# Patient Record
Sex: Female | Born: 1985
Health system: Southern US, Community
[De-identification: ages and names within clinical notes are randomized; demographics above are authoritative.]

## PROBLEM LIST (undated history)

## (undated) DIAGNOSIS — F329 Major depressive disorder, single episode, unspecified: Secondary | ICD-10-CM

## (undated) DIAGNOSIS — R519 Headache, unspecified: Secondary | ICD-10-CM

## (undated) DIAGNOSIS — G8929 Other chronic pain: Secondary | ICD-10-CM

## (undated) DIAGNOSIS — G5600 Carpal tunnel syndrome, unspecified upper limb: Secondary | ICD-10-CM

## (undated) DIAGNOSIS — F32A Depression, unspecified: Secondary | ICD-10-CM

## (undated) DIAGNOSIS — R51 Headache: Secondary | ICD-10-CM

## (undated) DIAGNOSIS — F419 Anxiety disorder, unspecified: Secondary | ICD-10-CM

## (undated) DIAGNOSIS — K289 Gastrojejunal ulcer, unspecified as acute or chronic, without hemorrhage or perforation: Secondary | ICD-10-CM

## (undated) HISTORY — DX: Other chronic pain: G89.29

## (undated) HISTORY — PX: NO PAST SURGERIES: SHX2092

## (undated) HISTORY — DX: Gastrojejunal ulcer, unspecified as acute or chronic, without hemorrhage or perforation: K28.9

## (undated) HISTORY — DX: Headache, unspecified: R51.9

## (undated) HISTORY — DX: Major depressive disorder, single episode, unspecified: F32.9

## (undated) HISTORY — DX: Anxiety disorder, unspecified: F41.9

## (undated) HISTORY — DX: Headache: R51

## (undated) HISTORY — DX: Depression, unspecified: F32.A

---

## 2011-05-17 ENCOUNTER — Other Ambulatory Visit (HOSPITAL_COMMUNITY)
Admission: RE | Admit: 2011-05-17 | Discharge: 2011-05-17 | Disposition: A | Discharge status: Home / Self Care | Payer: BC Managed Care – PPO | Source: Ambulatory Visit | Attending: Obstetrics and Gynecology | Admitting: Obstetrics and Gynecology

## 2011-05-17 DIAGNOSIS — Z124 Encounter for screening for malignant neoplasm of cervix: Secondary | ICD-10-CM | POA: Insufficient documentation

## 2012-11-03 ENCOUNTER — Ambulatory Visit (INDEPENDENT_AMBULATORY_CARE_PROVIDER_SITE_OTHER): Payer: BC Managed Care – PPO | Admitting: Physician Assistant

## 2012-11-03 VITALS — BP 112/76 | HR 88 | Temp 98.8°F | Resp 12 | Ht 64.0 in | Wt 160.0 lb

## 2012-11-03 DIAGNOSIS — A088 Other specified intestinal infections: Secondary | ICD-10-CM

## 2012-11-03 DIAGNOSIS — R197 Diarrhea, unspecified: Secondary | ICD-10-CM

## 2012-11-03 DIAGNOSIS — R112 Nausea with vomiting, unspecified: Secondary | ICD-10-CM

## 2012-11-03 DIAGNOSIS — A084 Viral intestinal infection, unspecified: Secondary | ICD-10-CM

## 2012-11-03 LAB — POCT CBC
HCT, POC: 41.9 % (ref 37.7–47.9)
Hemoglobin: 12.9 g/dL (ref 12.2–16.2)
Lymph, poc: 2.1 (ref 0.6–3.4)
MCH, POC: 26.1 pg — AB (ref 27–31.2)
MPV: 8.5 fL (ref 0–99.8)
POC MID %: 10 %M (ref 0–12)
RBC: 4.94 M/uL (ref 4.04–5.48)
WBC: 8.2 10*3/uL (ref 4.6–10.2)

## 2012-11-03 LAB — POCT URINALYSIS DIPSTICK
Glucose, UA: NEGATIVE
Spec Grav, UA: 1.025

## 2012-11-03 LAB — POCT UA - MICROSCOPIC ONLY
Bacteria, U Microscopic: NEGATIVE
Crystals, Ur, HPF, POC: NEGATIVE
Mucus, UA: POSITIVE

## 2012-11-03 LAB — POCT URINE PREGNANCY: Preg Test, Ur: NEGATIVE

## 2012-11-03 MED ORDER — ONDANSETRON 4 MG PO TBDP: 8.0000 mg | ORAL_TABLET | Freq: Once | ORAL | Status: AC

## 2012-11-03 MED ORDER — ONDANSETRON 8 MG PO TBDP: 8.0000 mg | ORAL_TABLET | Freq: Three times a day (TID) | ORAL | Status: DC | PRN

## 2012-11-03 NOTE — Progress Notes (Signed)
Subjective:    Patient ID: Catherine Downs, female    DOB: 1985-12-13, 27 y.o.   MRN: [id number]  HPI    Ms. Lanahan is a very pleasant 27 yr old female here with concern for illness.  States that 3 days ago she woke up with "severe stabbing" epigastric pain.  Vomited and was nauseous, couldn't eat.  Continued with nausea and vomiting yesterday and today, now with diarrhea as well.  Minimal appetite, difficulty keeping food down but is able to maintain hydration.  Denies fever or chills.  No sick contacts.  Has had maybe 6 episodes of vomiting and 3 episodes of diarrhea.  No recent travel.  Was at a wedding Saturday night, but aside from that no new foods.  No hx of heart burn or reflux.  No urinary symptoms.  No vaginal discharge or concern for STI.     Review of Systems  Constitutional: Negative for fever and chills.  HENT: Negative.   Respiratory: Negative.   Cardiovascular: Negative.   Gastrointestinal: Positive for nausea, vomiting, abdominal pain and diarrhea.  Musculoskeletal: Negative.   Skin: Negative.   Neurological: Negative.        Objective:   Physical Exam  Vitals reviewed. Constitutional: She is oriented to person, place, and time. She appears well-developed and well-nourished. No distress.  HENT:  Head: Normocephalic and atraumatic.  Eyes: Conjunctivae are normal. No scleral icterus.  Cardiovascular: Normal rate, regular rhythm and normal heart sounds.   Pulmonary/Chest: Effort normal and breath sounds normal. She has no wheezes. She has no rales.  Abdominal: Soft. Normal appearance and bowel sounds are normal. There is tenderness in the right upper quadrant, right lower quadrant and periumbilical area. There is no rigidity, no rebound, no guarding, no CVA tenderness and negative Murphy's sign.  Neurological: She is alert and oriented to person, place, and time.  Skin: Skin is warm and dry.  Psychiatric: She has a normal mood and affect. Her behavior is normal.      Filed Vitals:   11/03/12 1243  BP: 112/76  Pulse: 88  Temp: 98.8 F (37.1 C)  Resp: 12     Results for orders placed in visit on 11/03/12  POCT CBC      Result Value Range   WBC 8.2  4.6 - 10.2 K/uL   Lymph, poc 2.1  0.6 - 3.4   POC LYMPH PERCENT 25.5  10 - 50 %L   MID (cbc) 0.8  0 - 0.9   POC MID % 10.0  0 - 12 %M   POC Granulocyte 5.3  2 - 6.9   Granulocyte percent 64.5  37 - 80 %G   RBC 4.94  4.04 - 5.48 M/uL   Hemoglobin 12.9  12.2 - 16.2 g/dL   HCT, POC 40.9  81.1 - 47.9 %   MCV 84.8  80 - 97 fL   MCH, POC 26.1 (*) 27 - 31.2 pg   MCHC 30.8 (*) 31.8 - 35.4 g/dL   RDW, POC 91.4     Platelet Count, POC 382  142 - 424 K/uL   MPV 8.5  0 - 99.8 fL  POCT URINE PREGNANCY      Result Value Range   Preg Test, Ur Negative    POCT UA - MICROSCOPIC ONLY      Result Value Range   WBC, Ur, HPF, POC 0-1     RBC, urine, microscopic 0-3     Bacteria, U Microscopic negative  Mucus, UA positive     Epithelial cells, urine per micros 0-1     Crystals, Ur, HPF, POC negative     Casts, Ur, LPF, POC negative     Yeast, UA negative    POCT URINALYSIS DIPSTICK      Result Value Range   Color, UA yellow     Clarity, UA clear     Glucose, UA negative     Bilirubin, UA negative     Ketones, UA trace     Spec Grav, UA 1.025     Blood, UA trace-lysed     pH, UA 5.5     Protein, UA negative     Urobilinogen, UA 0.2     Nitrite, UA negative     Leukocytes, UA Negative         Assessment & Plan:  Viral gastroenteritis  Nausea with vomiting - Plan: POCT CBC, POCT urine pregnancy, POCT UA - Microscopic Only, POCT urinalysis dipstick, ondansetron (ZOFRAN-ODT) disintegrating tablet 8 mg, ondansetron (ZOFRAN-ODT) 8 MG disintegrating tablet  Diarrhea - Plan: POCT CBC, POCT UA - Microscopic Only, POCT urinalysis dipstick   Ms. Barbara is a very pleasant 27 yr old female here with three days of nausea, vomiting, diarrhea.  On exam she is well-appearing, afebrile, VSS.  There  is some more pronounced tenderness in the RUQ and RLQ, but generalized tenderness throughout the abdomen.  CBC is normal.  UA is normal.  HCG negative.  Suspect viral gastroenteritis.  Zofran given in the office and sent to pharmacy to use q8h prn.  Encouraged hydration.  Advance diet as tolerated.  Work note provided.  Discussed RTC and ED precautions with pt who understands and is in agreement with this plan.

## 2012-11-03 NOTE — Patient Instructions (Addendum)
Take the Zofran every 8 hours as needed for nausea.  Continue hydrating well.  Eat as you are able.  If your symptoms are worsening (worsening abdominal pain, worsening vomiting, fever, etc) please come back in or go to the emergency department.   Viral Gastroenteritis Viral gastroenteritis is also known as stomach flu. This condition affects the stomach and intestinal tract. It can cause sudden diarrhea and vomiting. The illness typically lasts 3 to 8 days. Most people develop an immune response that eventually gets rid of the virus. While this natural response develops, the virus can make you quite ill. CAUSES  Many different viruses can cause gastroenteritis, such as rotavirus or noroviruses. You can catch one of these viruses by consuming contaminated food or water. You may also catch a virus by sharing utensils or other personal items with an infected person or by touching a contaminated surface. SYMPTOMS  The most common symptoms are diarrhea and vomiting. These problems can cause a severe loss of body fluids (dehydration) and a body salt (electrolyte) imbalance. Other symptoms may include:  Fever.  Headache.  Fatigue.  Abdominal pain. DIAGNOSIS  Your caregiver can usually diagnose viral gastroenteritis based on your symptoms and a physical exam. A stool sample may also be taken to test for the presence of viruses or other infections. TREATMENT  This illness typically goes away on its own. Treatments are aimed at rehydration. The most serious cases of viral gastroenteritis involve vomiting so severely that you are not able to keep fluids down. In these cases, fluids must be given through an intravenous line (IV). HOME CARE INSTRUCTIONS   Drink enough fluids to keep your urine clear or pale yellow. Drink small amounts of fluids frequently and increase the amounts as tolerated.  Ask your caregiver for specific rehydration instructions.  Avoid:  Foods high in  sugar.  Alcohol.  Carbonated drinks.  Tobacco.  Juice.  Caffeine drinks.  Extremely hot or cold fluids.  Fatty, greasy foods.  Too much intake of anything at one time.  Dairy products until 24 to 48 hours after diarrhea stops.  You may consume probiotics. Probiotics are active cultures of beneficial bacteria. They may lessen the amount and number of diarrheal stools in adults. Probiotics can be found in yogurt with active cultures and in supplements.  Wash your hands well to avoid spreading the virus.  Only take over-the-counter or prescription medicines for pain, discomfort, or fever as directed by your caregiver. Do not give aspirin to children. Antidiarrheal medicines are not recommended.  Ask your caregiver if you should continue to take your regular prescribed and over-the-counter medicines.  Keep all follow-up appointments as directed by your caregiver. SEEK IMMEDIATE MEDICAL CARE IF:   You are unable to keep fluids down.  You do not urinate at least once every 6 to 8 hours.  You develop shortness of breath.  You notice blood in your stool or vomit. This may look like coffee grounds.  You have abdominal pain that increases or is concentrated in one small area (localized).  You have persistent vomiting or diarrhea.  You have a fever.  The patient is a child younger than 3 months, and he or she has a fever.  The patient is a child older than 3 months, and he or she has a fever and persistent symptoms.  The patient is a child older than 3 months, and he or she has a fever and symptoms suddenly get worse.  The patient is a baby, and  he or she has no tears when crying. MAKE SURE YOU:   Understand these instructions.  Will watch your condition.  Will get help right away if you are not doing well or get worse. Document Released: 07/08/2005 Document Revised: 09/30/2011 Document Reviewed: 04/24/2011 [facility] Patient Information 2013 [place], [place].

## 2015-07-20 ENCOUNTER — Emergency Department (HOSPITAL_COMMUNITY)
Admission: EM | Admit: 2015-07-20 | Discharge: 2015-07-20 | Disposition: A | Discharge status: Home / Self Care | Payer: BLUE CROSS/BLUE SHIELD | Attending: Emergency Medicine | Admitting: Emergency Medicine

## 2015-07-20 ENCOUNTER — Encounter (HOSPITAL_COMMUNITY): Payer: Self-pay | Admitting: Cardiology

## 2015-07-20 DIAGNOSIS — S199XXA Unspecified injury of neck, initial encounter: Secondary | ICD-10-CM | POA: Insufficient documentation

## 2015-07-20 DIAGNOSIS — Y9389 Activity, other specified: Secondary | ICD-10-CM | POA: Diagnosis not present

## 2015-07-20 DIAGNOSIS — W228XXA Striking against or struck by other objects, initial encounter: Secondary | ICD-10-CM | POA: Insufficient documentation

## 2015-07-20 DIAGNOSIS — S060X0A Concussion without loss of consciousness, initial encounter: Secondary | ICD-10-CM

## 2015-07-20 DIAGNOSIS — Y998 Other external cause status: Secondary | ICD-10-CM | POA: Insufficient documentation

## 2015-07-20 DIAGNOSIS — Z880 Allergy status to penicillin: Secondary | ICD-10-CM | POA: Insufficient documentation

## 2015-07-20 DIAGNOSIS — S0990XA Unspecified injury of head, initial encounter: Secondary | ICD-10-CM | POA: Diagnosis present

## 2015-07-20 DIAGNOSIS — Y92000 Kitchen of unspecified non-institutional (private) residence as  the place of occurrence of the external cause: Secondary | ICD-10-CM | POA: Diagnosis not present

## 2015-07-20 MED ORDER — TRAMADOL HCL 50 MG PO TABS: 50.0000 mg | ORAL_TABLET | Freq: Four times a day (QID) | ORAL | Status: DC | PRN

## 2015-07-20 NOTE — ED Notes (Signed)
Pt reports she was leaning down and come up hitting her head on a bar. [facility] to have a follow up CT done but has not been called about anything yet. Reports a headache

## 2015-07-20 NOTE — ED Provider Notes (Signed)
CSN: [id number]     Arrival date & time 07/20/15  1336 History  By signing my name below, I, Essence Howell, attest that this documentation has been prepared under the direction and in the presence of Marlon Pel, PA-C Electronically Signed: Charline Bills, ED Scribe 07/27/2015 at 2:56 PM.   Chief Complaint  Patient presents with  . Headache  . Fall   The history is provided by the patient. No language interpreter was used.   HPI Comments: Catherine Downs is a 29 y.o. female who presents to the Emergency Department complaining of persistent, unchanged HA for the past 8 days. Pt states that she was kneeling down in her kitchen when she struck the top of her head on the bar while standing 8 days ago. She was seen at Urgent [facility] at [facility] following the incident and was referred to [facility] imaging for a CT same day but she has not received a call from them to set up an appointment. Pt reports a constant, throbbing sensation to the top of her head where it hit since the incident. She also reports associated mild nausea, intermittent mild dizziness, disorientation, neck pain at the base of her head. She has tried Bupap with temporary relief. Pt denies vomiting.    ROS: The patient denies diaphoresis, fever, headache, weakness (general or focal), confusion, change of vision,  dysphagia, aphagia, shortness of breath,  abdominal pains, nausea, vomiting, diarrhea, lower extremity swelling, rash, neck pain, chest pain   History reviewed. No pertinent past medical history. History reviewed. No pertinent past surgical history. History reviewed. No pertinent family history. Social History  Substance Use Topics  . Smoking status: Never Smoker   . Smokeless tobacco: None  . Alcohol Use: No   OB History    No data available     Review of Systems  Gastrointestinal: Positive for nausea. Negative for vomiting.  Musculoskeletal: Positive for neck pain.  Neurological: Positive for dizziness and  headaches.  All other systems reviewed and are negative.  Allergies  Penicillins  Home Medications   Prior to Admission medications   Medication Sig Start Date End Date Taking? Authorizing Provider  ondansetron (ZOFRAN-ODT) 8 MG disintegrating tablet Take 1 tablet (8 mg total) by mouth every 8 (eight) hours as needed for nausea. 11/03/12   Eleanore Delia Chimes, PA-C  traMADol (ULTRAM) 50 MG tablet Take 1 tablet (50 mg total) by mouth every 6 (six) hours as needed. 07/20/15   Scarlette Hogston Neva Seat, PA-C   BP 117/90 mmHg  Pulse 88  Temp(Src) 98.8 F (37.1 C) (Oral)  Resp 18  Ht  (1.6 m)  Wt 169 lb (76.658 kg)  BMI 29.94 kg/m2  SpO2 98%  LMP 07/06/2015 Physical Exam  Constitutional: She is oriented to person, place, and time. She appears well-developed and well-nourished. No distress.  HENT:  Head: Normocephalic and atraumatic.  Right Ear: Tympanic membrane and ear canal normal.  Left Ear: Tympanic membrane and ear canal normal.  Nose: Nose normal.  Mouth/Throat: Uvula is midline, oropharynx is clear and moist and mucous membranes are normal.  Eyes: Conjunctivae and EOM are normal. Pupils are equal, round, and reactive to light.  Neck: Normal range of motion. Neck supple. Muscular tenderness present. No spinous process tenderness present. No rigidity. No tracheal deviation and normal range of motion present.  Cardiovascular: Normal rate and regular rhythm.   Pulmonary/Chest: Effort normal. No respiratory distress.  Abdominal: Soft.  No signs of abdominal distention  Musculoskeletal: Normal range of motion.  No LE swelling  Neurological: She is alert and oriented to person, place, and time.   optic, oculomotor, trochlear, trigeminal, abducens, facial, acoustic, speech and swallow (II-VIII and XII) cranial nerves grossly intact on exam. Pt alert and oriented x 3 Upper and lower extremity strength is symmetrical and physiologic Normal muscular tone No facial droop Coordination intact,  no limb ataxia, finger-nose-finger normal Rapid alternating movements normal No pronator drift   Skin: Skin is warm and dry. No rash noted.  Psychiatric: She has a normal mood and affect. Her behavior is normal.  Nursing note and vitals reviewed.  ED Course  Procedures (including critical care time) DIAGNOSTIC STUDIES: Oxygen Saturation is 98% on RA, normal by my interpretation.    COORDINATION OF CARE:   2:45 PM-Discussed treatment plan which includes Ultram with pt at bedside and pt agreed to plan.  I told pt that 8 days out from injury and norma, neuro exam I did not feel like it was warranted but if she would like a CT I would order. She declined CT scan of the head. Pt also offered declined IV medications for headache but she declined and did not want any IV medications.   Labs Review Labs Reviewed - No data to display  Imaging Review No results found. I have personally reviewed and evaluated these images and lab results as part of my medical decision-making.   EKG Interpretation None      MDM   Final diagnoses:  Concussion, without loss of consciousness, initial encounter   Pt HA treated and improved while in ED.  Presentation is like pts typical HA and non [facility], Meningitis, or temporal arteritis. Pt is afebrile with no focal neuro deficits, nuchal rigidity, or change in vision. Pt is to follow up with PCP to discuss prophylactic medication. Pt verbalizes understanding and is agreeable with plan to dc.   Presentation is non concerning for [facility], ICH, Meningitis, or temporal arteritis. Pt is afebrile with no focal neuro deficits, nuchal rigidity, or change in vision. The patient denies any symptoms of neurological impairment or TIA's; no amaurosis, diplopia, dysphasia, or unilateral disturbance of motor or sensory function. No loss of balance or vertigo.   I personally performed the services described in this documentation, which was scribed in my  presence. The recorded information has been reviewed and is accurate.    Marlon Peliffany Kaegan Stigler, PA-C 07/27/15 [id number]  Donnetta HutchingBrian Cook, MD 08/04/15 [phone]

## 2015-07-20 NOTE — Discharge Instructions (Signed)
Concussion, Adult  A concussion, or closed-head injury, is a brain injury caused by a direct blow to the head or by a quick and sudden movement (jolt) of the head or neck. Concussions are usually not life-threatening. Even so, the effects of a concussion can be serious. If you have had a concussion before, you are more likely to experience concussion-like symptoms after a direct blow to the head.   CAUSES  · Direct blow to the head, such as from running into another player during a soccer game, being hit in a fight, or hitting your head on a hard surface.  · A jolt of the head or neck that causes the brain to move back and forth inside the skull, such as in a car crash.  SIGNS AND SYMPTOMS  The signs of a concussion can be hard to notice. Early on, they may be missed by you, family members, and health care providers. You may look fine but act or feel differently.  Symptoms are usually temporary, but they may last for days, weeks, or even longer. Some symptoms may appear right away while others may not show up for hours or days. Every head injury is different. Symptoms include:  · Mild to moderate headaches that will not go away.  · A feeling of pressure inside your head.  · Having more trouble than usual:    Learning or remembering things you have heard.    Answering questions.    Paying attention or concentrating.    Organizing daily tasks.    Making decisions and solving problems.  · Slowness in thinking, acting or reacting, speaking, or reading.  · Getting lost or being easily confused.  · Feeling tired all the time or lacking energy (fatigued).  · Feeling drowsy.  · Sleep disturbances.    Sleeping more than usual.    Sleeping less than usual.    Trouble falling asleep.    Trouble sleeping (insomnia).  · Loss of balance or feeling lightheaded or dizzy.  · Nausea or vomiting.  · Numbness or tingling.  · Increased sensitivity to:    Sounds.    Lights.    Distractions.  · Vision problems or eyes that tire  easily.  · Diminished sense of taste or smell.  · Ringing in the ears.  · Mood changes such as feeling sad or anxious.  · Becoming easily irritated or angry for little or no reason.  · Lack of motivation.  · Seeing or hearing things other people do not see or hear (hallucinations).  DIAGNOSIS  Your health care provider can usually diagnose a concussion based on a description of your injury and symptoms. He or she will ask whether you passed out (lost consciousness) and whether you are having trouble remembering events that happened right before and during your injury.  Your evaluation might include:  · A brain scan to look for signs of injury to the brain. Even if the test shows no injury, you may still have a concussion.  · Blood tests to be sure other problems are not present.  TREATMENT  · Concussions are usually treated in an emergency department, in urgent care, or at a clinic. You may need to stay in the hospital overnight for further treatment.  · Tell your health care provider if you are taking any medicines, including prescription medicines, over-the-counter medicines, and natural remedies. Some medicines, such as blood thinners (anticoagulants) and aspirin, may increase the chance of complications. Also tell your health care   provider whether you have had alcohol or are taking illegal drugs. This information may affect treatment.  · Your health care provider will send you home with important instructions to follow.  · How fast you will recover from a concussion depends on many factors. These factors include how severe your concussion is, what part of your brain was injured, your age, and how healthy you were before the concussion.  · Most people with mild injuries recover fully. Recovery can take time. In general, recovery is slower in older persons. Also, persons who have had a concussion in the past or have other medical problems may find that it takes longer to recover from their current injury.  HOME  CARE INSTRUCTIONS  General Instructions  · Carefully follow the directions your health care provider gave you.  · Only take over-the-counter or prescription medicines for pain, discomfort, or fever as directed by your health care provider.  · Take only those medicines that your health care provider has approved.  · Do not drink alcohol until your health care provider says you are well enough to do so. Alcohol and certain other drugs may slow your recovery and can put you at risk of further injury.  · If it is harder than usual to remember things, write them down.  · If you are easily distracted, try to do one thing at a time. For example, do not try to watch TV while fixing dinner.  · Talk with family members or close friends when making important decisions.  · Keep all follow-up appointments. Repeated evaluation of your symptoms is recommended for your recovery.  · Watch your symptoms and tell others to do the same. Complications sometimes occur after a concussion. Older adults with a brain injury may have a higher risk of serious complications, such as a blood clot on the brain.  · Tell your teachers, school nurse, school counselor, coach, athletic trainer, or work manager about your injury, symptoms, and restrictions. Tell them about what you can or cannot do. They should watch for:    Increased problems with attention or concentration.    Increased difficulty remembering or learning new information.    Increased time needed to complete tasks or assignments.    Increased irritability or decreased ability to cope with stress.    Increased symptoms.  · Rest. Rest helps the brain to heal. Make sure you:    Get plenty of sleep at night. Avoid staying up late at night.    Keep the same bedtime hours on weekends and weekdays.    Rest during the day. Take daytime naps or rest breaks when you feel tired.  · Limit activities that require a lot of thought or concentration. These include:    Doing homework or job-related  work.    Watching TV.    Working on the computer.  · Avoid any situation where there is potential for another head injury (football, hockey, soccer, basketball, martial arts, downhill snow sports and horseback riding). Your condition will get worse every time you experience a concussion. You should avoid these activities until you are evaluated by the appropriate follow-up health care providers.  Returning To Your Regular Activities  You will need to return to your normal activities slowly, not all at once. You must give your body and brain enough time for recovery.  · Do not return to sports or other athletic activities until your health care provider tells you it is safe to do so.  · Ask   your health care provider when you can drive, ride a bicycle, or operate heavy machinery. Your ability to react may be slower after a brain injury. Never do these activities if you are dizzy.  · Ask your health care provider about when you can return to work or school.  Preventing Another Concussion  It is very important to avoid another brain injury, especially before you have recovered. In rare cases, another injury can lead to permanent brain damage, brain swelling, or death. The risk of this is greatest during the first 7-10 days after a head injury. Avoid injuries by:  · Wearing a seat belt when riding in a car.  · Drinking alcohol only in moderation.  · Wearing a helmet when biking, skiing, skateboarding, skating, or doing similar activities.  · Avoiding activities that could lead to a second concussion, such as contact or recreational sports, until your health care provider says it is okay.  · Taking safety measures in your home.    Remove clutter and tripping hazards from floors and stairways.    Use grab bars in bathrooms and handrails by stairs.    Place non-slip mats on floors and in bathtubs.    Improve lighting in dim areas.  SEEK MEDICAL CARE IF:  · You have increased problems paying attention or  concentrating.  · You have increased difficulty remembering or learning new information.  · You need more time to complete tasks or assignments than before.  · You have increased irritability or decreased ability to cope with stress.  · You have more symptoms than before.  Seek medical care if you have any of the following symptoms for more than 2 weeks after your injury:  · Lasting (chronic) headaches.  · Dizziness or balance problems.  · Nausea.  · Vision problems.  · Increased sensitivity to noise or light.  · Depression or mood swings.  · Anxiety or irritability.  · Memory problems.  · Difficulty concentrating or paying attention.  · Sleep problems.  · Feeling tired all the time.  SEEK IMMEDIATE MEDICAL CARE IF:  · You have severe or worsening headaches. These may be a sign of a blood clot in the brain.  · You have weakness (even if only in one hand, leg, or part of the face).  · You have numbness.  · You have decreased coordination.  · You vomit repeatedly.  · You have increased sleepiness.  · One pupil is larger than the other.  · You have convulsions.  · You have slurred speech.  · You have increased confusion. This may be a sign of a blood clot in the brain.  · You have increased restlessness, agitation, or irritability.  · You are unable to recognize people or places.  · You have neck pain.  · It is difficult to wake you up.  · You have unusual behavior changes.  · You lose consciousness.  MAKE SURE YOU:  · Understand these instructions.  · Will watch your condition.  · Will get help right away if you are not doing well or get worse.     This information is not intended to replace advice given to you by your health care provider. Make sure you discuss any questions you have with your health care provider.     Document Released: 09/28/2003 Document Revised: 07/29/2014 Document Reviewed: 01/28/2013  Elsevier Interactive Patient Education ©2016 [employer] Inc[employer]

## 2015-07-31 ENCOUNTER — Telehealth: Payer: Self-pay | Admitting: *Deleted

## 2015-07-31 NOTE — Telephone Encounter (Signed)
Called home number and could not LVM. Tried work number and pt no longer works there. Took number out. Called spouse, advised appt tomorrow needs to be r/s d/t weather. We are closed today and tomorrow until 10am. Gave available appt options this week. He will let wife know to call Tuesday after 10am to r/s appt. She was at work.

## 2015-08-01 ENCOUNTER — Ambulatory Visit: Payer: BLUE CROSS/BLUE SHIELD | Admitting: Neurology

## 2015-08-02 ENCOUNTER — Telehealth: Payer: Self-pay | Admitting: Neurology

## 2015-08-03 ENCOUNTER — Encounter: Payer: Self-pay | Admitting: Neurology

## 2015-08-03 ENCOUNTER — Ambulatory Visit (INDEPENDENT_AMBULATORY_CARE_PROVIDER_SITE_OTHER): Payer: BLUE CROSS/BLUE SHIELD | Admitting: Neurology

## 2015-08-03 VITALS — BP 124/75 | HR 85 | Resp 20 | Ht 63.0 in | Wt 171.0 lb

## 2015-08-03 DIAGNOSIS — F0781 Postconcussional syndrome: Secondary | ICD-10-CM

## 2015-08-03 DIAGNOSIS — R51 Headache: Secondary | ICD-10-CM | POA: Diagnosis not present

## 2015-08-03 DIAGNOSIS — R519 Headache, unspecified: Secondary | ICD-10-CM

## 2015-08-03 DIAGNOSIS — S0990XA Unspecified injury of head, initial encounter: Secondary | ICD-10-CM

## 2015-08-03 MED ORDER — CYCLOBENZAPRINE HCL 5 MG PO TABS: 5.0000 mg | ORAL_TABLET | Freq: Three times a day (TID) | ORAL | Status: DC | PRN

## 2015-08-03 MED ORDER — TRAMADOL HCL 50 MG PO TABS: 50.0000 mg | ORAL_TABLET | Freq: Four times a day (QID) | ORAL | Status: DC | PRN

## 2015-08-03 MED ORDER — NORTRIPTYLINE HCL 10 MG PO CAPS: 30.0000 mg | ORAL_CAPSULE | Freq: Every day | ORAL | Status: DC

## 2015-08-03 NOTE — Patient Instructions (Signed)
Remember to drink plenty of fluid, eat healthy meals and do not skip any meals. Try to eat protein with a every meal and eat a healthy snack such as fruit or nuts in between meals. Try to keep a regular sleep-wake schedule and try to exercise daily, particularly in the form of walking, 20-30 minutes a day, if you can.   As far as your medications are concerned, I would like to suggest: Tramadol for severe pain Nortriptyline at bedtime. Start with 10mg  and increase to 30mg  at night Flexeril up to 3x a day for headache and neck spasms  As far as diagnostic testing: CT of the head  I would like to see you back as needed, sooner if we need to. Please call us with any interim questions, concerns, problems, updates or refill requests.   Our phone number is [phone]. We also have an after hours call service for urgent matters and there is a physician on-call for urgent questions. For any emergencies you know to call 911 or go to the nearest emergency room

## 2015-08-03 NOTE — Progress Notes (Signed)
[facility]    Provider:  Dr Lucia Gaskins Referring Provider: No ref. provider found Primary Care Physician:  Dolan Amen, FNP  CC:  Headache  HPI:  Catherine Downs is a 30 y.o. female here as a referral from Dr. Fredric Mare. No significant PMHx. No FHx of headaches. No surgical history. She hit her head in December and she is having headaches. She stood up in her kitchen and hit her head on a bar.  Headaches are in the temples and in the base of the neck. She hit a bar, it knocked her for a loop, no LOC, she was disoriented. She tried to get up and do something, she had to sit for 20 minutes She had nausea, no vomiting. She has decreased concentration, sensitive to light, Her sleeping is restless. She takes some tramadol. The headaches are constant throughout the day, lingering, On average it is a 6/10. Her eyes feel tired. No blurry vision or double vision. She is only getting 4-5 hours of sleep. She is taking tramadol up to 3x a day. No other neurologic deficits.   Reviewed notes, labs and imaging from outside physicians, which showed:  She was seen in the ED on 12/29 after hitting her head and experiencing headaches for 8 days. She was seen at [facility] urgent care right after the head trauma. She did not get imaging. Pt reported a constant, throbbing sensation to the top of her head where it hit since the incident. She also reported associated mild nausea, intermittent mild dizziness, disorientation, neck pain at the base of her head. She has tried Bupap with temporary relief. Pt denies vomiting. Neuro exam was non-focal and she was discharged with Ultram.   Review of Systems: Patient complains of symptoms per HPI as well as the following symptoms: headache, nausea, disorientation. No CP, no SOB. Pertinent negatives per HPI. All others negative.   Social History   Social History  . Marital Status: Unknown    Spouse Name: N/A  . Number of Children: N/A  . Years of  Education: N/A   Occupational History  .  [employer]   Social History Main Topics  . Smoking status: Never Smoker   . Smokeless tobacco: Not on file  . Alcohol Use: 0.0 oz/week    0 Standard drinks or equivalent per week     Comment: rarely  . Drug Use: No  . Sexual Activity: Not on file   Other Topics Concern  . Not on file   Social History Narrative   Drinks 1 cup of coffee 3 days per week.    Family History  Problem Relation Age of Onset  . Diabetes Mother   . Diabetes Father   . Leukemia Maternal Grandfather   . Parkinson's disease Paternal Grandfather   . Stroke Paternal Grandfather   . Migraines Neg Hx     No past medical history on file.  No past surgical history on file.  Current Outpatient Prescriptions  Medication Sig Dispense Refill  . traMADol (ULTRAM) 50 MG tablet Take 1 tablet (50 mg total) by mouth every 6 (six) hours as needed. 30 tablet 2  . cyclobenzaprine (FLEXERIL) 5 MG tablet Take 1 tablet (5 mg total) by mouth 3 (three) times daily as needed for muscle spasms. 90 tablet 3  . nortriptyline (PAMELOR) 10 MG capsule Take 3 capsules (30 mg total) by mouth at bedtime. 90 capsule 3   Current Facility-Administered Medications  Medication Dose Route Frequency Provider Last Rate Last  Dose  . ondansetron (ZOFRAN-ODT) disintegrating tablet 8 mg  8 mg Oral Once [employer], PA-C        Allergies as of 08/03/2015 - Review Complete 08/03/2015  Allergen Reaction Noted  . Penicillins Swelling 11/03/2012    Vitals: BP 124/75 mmHg  Pulse 85  Resp 20  Ht 5\' 3"  (1.6 m)  Wt 171 lb (77.565 kg)  BMI 30.30 kg/m2  LMP 07/06/2015 Last Weight:  Wt Readings from Last 1 Encounters:  08/03/15 171 lb (77.565 kg)   Last Height:   Ht Readings from Last 1 Encounters:  08/03/15 5\' 3"  (1.6 m)     Physical exam: Exam: Gen: NAD, conversant, well nourised, obese, well groomed                     CV: RRR, no MRG. No Carotid Bruits. No peripheral edema,  warm, nontender Eyes: Conjunctivae clear without exudates or hemorrhage  Neuro: Detailed Neurologic Exam  Speech:    Speech is normal; fluent and spontaneous with normal comprehension.  Cognition:    The patient is oriented to person, place, and time;     recent and remote memory intact;     language fluent;     normal attention, concentration,     fund of knowledge Cranial Nerves:    The pupils are equal, round, and reactive to light. The fundi are normal and spontaneous venous pulsations are present. Visual fields are full to finger confrontation. Extraocular movements are intact. Trigeminal sensation is intact and the muscles of mastication are normal. The face is symmetric. The palate elevates in the midline. Hearing intact. Voice is normal. Shoulder shrug is normal. The tongue has normal motion without fasciculations.   Coordination:    Normal finger to nose and heel to shin. Normal rapid alternating movements.   Gait:    Heel-toe and tandem gait are normal.   Motor Observation:    No asymmetry, no atrophy, and no involuntary movements noted. Tone:    Normal muscle tone.    Posture:    Posture is normal. normal erect    Strength:    Strength is V/V in the upper and lower limbs.      Sensation: intact to LT     Reflex Exam:  DTR's:    Deep tendon reflexes in the upper and lower extremities are normal bilaterally.   Toes:    The toes are downgoing bilaterally.   Clonus:    Clonus is absent.     Assessment/Plan:  30 year old with mild post-concussive syndrome.Discussed with patient at length. Rest is important in concussion recovery. Recommend shortened work days, working from home if she can, taking frequent breaks. No strenuous activity, limiting computer and reading time. Continue heating pad. Take flexeril prn for muscular relief CT of the head.  Cervical pillow at night Nortriptylline 10mg  to 30mg  qhs Ultram for severe pain only, can cause rebound  headaches Order BMP F/u with pcp  CC: Dr. Fredric Mare  Discussed side effects nortrip including teratogenicity, do not Pregnant on this medication and use birth control. Serious side effects can include hypotension, hypertension, syncope, ventricular arrhythmias, QT prolongation and other cardiac side effects, stroke and seizures, ataxia tardive dyskinesias, extrapyramidal symptoms, increased intraocular pressure, leukopenia, thrombocytopenia, hallucinations, suicidality and other serious side effects. Common reactions include drowsiness, dry mouth, dizziness, constipation, blurred vision, palpitations, tachycardia, impaired coordination, increased appetite, nausea vomiting, weakness, confusion, disorientation, restlessness, anxiety and other side effects.  To prevent or  relieve headaches, try the following: Cool Compress. Lie down and place a cool compress on your head.  Avoid headache triggers. If certain foods or odors seem to have triggered your migraines in the past, avoid them. A headache diary might help you identify triggers.  Include physical activity in your daily routine. Try a daily walk or other moderate aerobic exercise.  Manage stress. Find healthy ways to cope with the stressors, such as delegating tasks on your to-do list.  Practice relaxation techniques. Try deep breathing, yoga, massage and visualization.  Eat regularly. Eating regularly scheduled meals and maintaining a healthy diet might help prevent headaches. Also, drink plenty of fluids.  Follow a regular sleep schedule. Sleep deprivation might contribute to headaches Consider biofeedback. With this mind-body technique, you learn to control certain bodily functions - such as muscle tension, heart rate and blood pressure - to prevent headaches or reduce headache pain.    Proceed to emergency room if you experience new or worsening symptoms or symptoms do not resolve, if you have new neurologic symptoms or if headache is severe,  or for any concerning symptom.        Naomie DeanAntonia Ahern, MD  [facility] [address] [place], [place] [id number]  Phone [phone] Fax [phone]

## 2015-08-09 NOTE — Telephone Encounter (Signed)
error 

## 2017-09-01 ENCOUNTER — Other Ambulatory Visit: Payer: Self-pay

## 2017-09-01 ENCOUNTER — Ambulatory Visit: Payer: 59 | Admitting: Family Medicine

## 2017-09-01 ENCOUNTER — Encounter: Payer: Self-pay | Admitting: Family Medicine

## 2017-09-01 VITALS — BP 108/82 | HR 106 | Temp 98.7°F | Resp 16 | Ht 64.37 in | Wt 174.0 lb

## 2017-09-01 DIAGNOSIS — R1111 Vomiting without nausea: Secondary | ICD-10-CM

## 2017-09-01 DIAGNOSIS — K2950 Unspecified chronic gastritis without bleeding: Secondary | ICD-10-CM | POA: Diagnosis not present

## 2017-09-01 DIAGNOSIS — R1013 Epigastric pain: Secondary | ICD-10-CM

## 2017-09-01 MED ORDER — PANTOPRAZOLE SODIUM 40 MG PO TBEC: 40.0000 mg | DELAYED_RELEASE_TABLET | Freq: Every day | ORAL | 3 refills | Status: AC

## 2017-09-01 NOTE — Patient Instructions (Addendum)
     IF you received an x-ray today, you will receive an invoice from [facility]. Please contact [facility] at [phone] with questions or concerns regarding your invoice.   IF you received labwork today, you will receive an invoice from [facility]. Please contact [facility] at [phone] with questions or concerns regarding your invoice.   Our billing staff will not be able to assist you with questions regarding bills from these companies.  You will be contacted with the lab results as soon as they are available. The fastest way to get your results is to activate your My Chart account. Instructions are located on the last page of this paperwork. If you have not heard from us regarding the results in 2 weeks, please contact this office.     Gastritis, Adult Gastritis is inflammation of the stomach. There are two kinds of gastritis:  Acute gastritis. This kind develops suddenly.  Chronic gastritis. This kind lasts for a long time.  Gastritis happens when the lining of the stomach becomes weak or gets damaged. Without treatment, gastritis can lead to stomach bleeding and ulcers. What are the causes? This condition may be caused by:  An infection.  Drinking too much alcohol.  Certain medicines.  Having too much acid in the stomach.  A disease of the intestines or stomach.  Stress.  What are the signs or symptoms? Symptoms of this condition include:  Pain or a burning in the upper abdomen.  Nausea.  Vomiting.  An uncomfortable feeling of fullness after eating.  In some cases, there are no symptoms. How is this diagnosed? This condition may be diagnosed with:  A description of your symptoms.  A physical exam.  Tests. These can include: ? Blood tests. ? Stool tests. ? A test in which a thin, flexible instrument with a light and camera on the end is passed down the esophagus and into the stomach (upper endoscopy). ? A test in which a sample of  tissue is taken for testing (biopsy).  How is this treated? This condition may be treated with medicines. If the condition is caused by a bacterial infection, you may be given antibiotic medicines. If it is caused by too much acid in the stomach, you may get medicines called H2 blockers, proton pump inhibitors, or antacids. Treatment may also involve stopping the use of certain medicines, such as aspirin, ibuprofen, or other nonsteroidal anti-inflammatory drugs (NSAIDs). Follow these instructions at home:  Take over-the-counter and prescription medicines only as told by your health care provider.  If you were prescribed an antibiotic, take it as told by your health care provider. Do not stop taking the antibiotic even if you start to feel better.  Drink enough fluid to keep your urine clear or pale yellow.  Eat small, frequent meals instead of large meals. Contact a health care provider if:  Your symptoms get worse.  Your symptoms return after treatment. Get help right away if:  You vomit blood or material that looks like coffee grounds.  You have black or dark red stools.  You are unable to keep fluids down.  Your abdominal pain gets worse.  You have a fever.  You do not feel better after 1 week. This information is not intended to replace advice given to you by your health care provider. Make sure you discuss any questions you have with your health care provider. Document Released: 07/02/2001 Document Revised: 03/06/2016 Document Reviewed: 04/01/2015 Elsevier Interactive Patient Education  [employer]2018 [employer]

## 2017-09-01 NOTE — Progress Notes (Signed)
Chief Complaint  Patient presents with  . New Patient (Initial Visit)    establish care.  Last week bad episode of acid reflux everything she would eat/drink severe emesis, nausea, chills, sharp pain and fever x few days.  Tried pepcid which usually he;ps but it didn't continued to have emesis.    HPI  First episode 2 years ago She reports that she was using antacids otc She states that she had a bleeding ulcer with blood in her stool (melena) She reports that she ate a lot of greasy foods over superbowl She states that she had emesis for a couple of days She reports that she was feverish for 2 days a week ago She states that she has not vomited since Friday morning She is a nonsmoker She drinks alcohol depending on what is going on She typically 2-4 alcoholic beverages usually wine She drinks at least 1 cup of coffee daily Denies energy drinks She only uses ibuprofen about once a week She states that she has some stress but that she feels pretty even keel She reports a family history of ulcers in her father Her grandmother had to have her gallbladder removed and told her it is sound like she has gallbladder problems She has only been able to keep down broth   She denies unexplained  She reports that she has not been to the dentist She denies sore throat and vocal cord changes   Past Medical History:  Diagnosis Date  . Anxiety   . Ulcer jejunum     Current Outpatient Medications  Medication Sig Dispense Refill  . pantoprazole (PROTONIX) 40 MG tablet Take 1 tablet (40 mg total) by mouth daily. 30 tablet 3   Current Facility-Administered Medications  Medication Dose Route Frequency Provider Last Rate Last Dose  . ondansetron (ZOFRAN-ODT) disintegrating tablet 8 mg  8 mg Oral Once Catherine Pick, PA-C        Allergies:  Allergies  Allergen Reactions  . Penicillins Swelling    No past surgical history on file.  Social History   Socioeconomic History  .  Marital status: Unknown    Spouse name: None  . Number of children: None  . Years of education: None  . Highest education level: None  Social Needs  . Financial resource strain: None  . Food insecurity - worry: None  . Food insecurity - inability: None  . Transportation needs - medical: None  . Transportation needs - non-medical: None  Occupational History    Employer: [employer]  Tobacco Use  . Smoking status: Never Smoker  . Smokeless tobacco: Never Used  Substance and Sexual Activity  . Alcohol use: Yes    Alcohol/week: 0.0 oz    Comment: rarely  . Drug use: No  . Sexual activity: None  Other Topics Concern  . None  Social History Narrative   Drinks 1 cup of coffee 3 days per week.    Family History  Problem Relation Age of Onset  . Diabetes Mother   . Hyperlipidemia Mother   . Hypertension Mother   . Diabetes Father   . Hyperlipidemia Father   . Hypertension Father   . Leukemia Maternal Grandfather   . Cancer Maternal Grandfather   . Parkinson's disease Paternal Grandfather   . Stroke Paternal Grandfather   . Diabetes Paternal Grandfather   . Heart disease Paternal Grandfather   . Hyperlipidemia Paternal Grandfather   . Diabetes Sister   . Hypertension Sister   . Diabetes  Maternal Grandmother   . Hyperlipidemia Maternal Grandmother   . Hypertension Maternal Grandmother   . Diabetes Paternal Grandmother   . Hyperlipidemia Paternal Grandmother   . Hypertension Paternal Grandmother   . Hypertension Sister   . Migraines Neg Hx      ROS Review of Systems See HPI Constitution: No fevers or chills No malaise No diaphoresis Skin: No rash or itching Eyes: no blurry vision, no double vision GU: no dysuria or hematuria Neuro: no dizziness or headaches  all others reviewed and negative   Objective: Vitals:   09/01/17 0816  BP: 108/82  Pulse: (!) 106  Resp: 16  Temp: 98.7 F (37.1 C)  TempSrc: Oral  SpO2: 96%  Weight: 174 lb (78.9 kg)    Height: 5' 4.37" (1.635 m)    Physical Exam  Constitutional: She is oriented to person, place, and time. She appears well-developed and well-nourished.  HENT:  Head: Normocephalic and atraumatic.  Eyes: Conjunctivae and EOM are normal.  Neck: Normal range of motion. Neck supple.  Cardiovascular: Normal rate, regular rhythm and normal heart sounds.  No murmur heard. Pulmonary/Chest: Effort normal and breath sounds normal. No stridor. No respiratory distress. She has no wheezes.  Abdominal: Soft. Bowel sounds are normal. She exhibits no distension and no mass. There is no tenderness. There is no guarding.  Neurological: She is alert and oriented to person, place, and time.  Skin: Skin is warm. Capillary refill takes less than 2 seconds.  Psychiatric: She has a normal mood and affect. Her behavior is normal. Judgment and thought content normal.    Assessment and Plan Catherine Downs was seen today for new patient (initial visit).  Diagnoses and all orders for this visit:  Epigastric pain- discussed testing and differential diagnosis -     H. pylori breath test -     Comprehensive metabolic panel -     CBC  Non-intractable vomiting without nausea, unspecified vomiting type- H. Pylori today and pt to resume antiacids  Chronic gastritis without bleeding, unspecified gastritis type- will order endoscopy if H. Pylori is negative  Other orders -     Cancel: Tdap vaccine greater than or equal to 7yo IM -     Cancel: Flu Vaccine QUAD 36+ mos IM -     pantoprazole (PROTONIX) 40 MG tablet; Take 1 tablet (40 mg total) by mouth daily.     Catherine Downs A Catherine Downs

## 2017-09-02 LAB — CBC
HEMATOCRIT: 41.1 % (ref 34.0–46.6)
HEMOGLOBIN: 13.3 g/dL (ref 11.1–15.9)
MCH: 28.2 pg (ref 26.6–33.0)
MCHC: 32.4 g/dL (ref 31.5–35.7)
MCV: 87 fL (ref 79–97)
Platelets: 349 10*3/uL (ref 150–379)
RBC: 4.72 x10E6/uL (ref 3.77–5.28)
RDW: 14.1 % (ref 12.3–15.4)
WBC: 7.3 10*3/uL (ref 3.4–10.8)

## 2017-09-02 LAB — COMPREHENSIVE METABOLIC PANEL
ALT: 24 IU/L (ref 0–32)
AST: 19 IU/L (ref 0–40)
Albumin/Globulin Ratio: 1.6 (ref 1.2–2.2)
Albumin: 4.5 g/dL (ref 3.5–5.5)
Alkaline Phosphatase: 59 IU/L (ref 39–117)
BILIRUBIN TOTAL: 0.3 mg/dL (ref 0.0–1.2)
BUN/Creatinine Ratio: 7 — ABNORMAL LOW (ref 9–23)
BUN: 6 mg/dL (ref 6–20)
CALCIUM: 9.5 mg/dL (ref 8.7–10.2)
CHLORIDE: 101 mmol/L (ref 96–106)
CO2: 22 mmol/L (ref 20–29)
Creatinine, Ser: 0.89 mg/dL (ref 0.57–1.00)
GFR calc non Af Amer: 87 mL/min/{1.73_m2} (ref >59)
GFR, EST AFRICAN AMERICAN: 101 mL/min/{1.73_m2} (ref >59)
GLUCOSE: 99 mg/dL (ref 65–99)
Globulin, Total: 2.9 g/dL (ref 1.5–4.5)
Potassium: 4.2 mmol/L (ref 3.5–5.2)
Sodium: 139 mmol/L (ref 134–144)
TOTAL PROTEIN: 7.4 g/dL (ref 6.0–8.5)

## 2017-09-02 LAB — H. PYLORI BREATH TEST: H PYLORI BREATH TEST: NEGATIVE

## 2017-09-03 NOTE — Addendum Note (Signed)
Addended by: Collie SiadSTALLINGS, Baylie Drakes A on: 09/03/2017 12:38 PM   Modules accepted: Orders

## 2017-09-16 ENCOUNTER — Encounter: Payer: Self-pay | Admitting: Gastroenterology

## 2017-10-16 ENCOUNTER — Ambulatory Visit: Payer: 59 | Admitting: Family Medicine

## 2017-10-22 ENCOUNTER — Encounter: Payer: Self-pay | Admitting: Family Medicine

## 2017-10-22 ENCOUNTER — Other Ambulatory Visit: Payer: Self-pay

## 2017-10-22 ENCOUNTER — Ambulatory Visit: Payer: 59 | Admitting: Family Medicine

## 2017-10-22 VITALS — BP 110/74 | HR 87 | Temp 98.6°F | Resp 17 | Ht 64.37 in | Wt 177.0 lb

## 2017-10-22 DIAGNOSIS — K219 Gastro-esophageal reflux disease without esophagitis: Secondary | ICD-10-CM

## 2017-10-22 DIAGNOSIS — K2950 Unspecified chronic gastritis without bleeding: Secondary | ICD-10-CM | POA: Diagnosis not present

## 2017-10-22 NOTE — Progress Notes (Signed)
Chief Complaint  Patient presents with  . Abdominal Pain    per pt better    HPI  Last visit was 09/01/17 for severe reflux She had testing for H. Pylori, started on protonix and given information for dietary modification She was advised that if she did not improve with protonix she would be referred to EGD  She is not having any epigastric pain or nausea. She is drinking celery juice.  She denies nausea or belching  Her H. Pylori breath test was negative.     Past Medical History:  Diagnosis Date  . Anxiety   . Ulcer jejunum     Current Outpatient Medications  Medication Sig Dispense Refill  . pantoprazole (PROTONIX) 40 MG tablet Take 1 tablet (40 mg total) by mouth daily. 30 tablet 3   Current Facility-Administered Medications  Medication Dose Route Frequency Provider Last Rate Last Dose  . ondansetron (ZOFRAN-ODT) disintegrating tablet 8 mg  8 mg Oral Once Godfrey Pick, PA-C        Allergies:  Allergies  Allergen Reactions  . Penicillins Swelling    No past surgical history on file.  Social History   Socioeconomic History  . Marital status: Unknown    Spouse name: Not on file  . Number of children: Not on file  . Years of education: Not on file  . Highest education level: Not on file  Occupational History    Employer: [employer]  Social Needs  . Financial resource strain: Not on file  . Food insecurity:    Worry: Not on file    Inability: Not on file  . Transportation needs:    Medical: Not on file    Non-medical: Not on file  Tobacco Use  . Smoking status: Never Smoker  . Smokeless tobacco: Never Used  Substance and Sexual Activity  . Alcohol use: Yes    Alcohol/week: 0.0 oz    Comment: rarely  . Drug use: No  . Sexual activity: Not on file  Lifestyle  . Physical activity:    Days per week: Not on file    Minutes per session: Not on file  . Stress: Not on file  Relationships  . Social connections:    Talks on phone: Not on file      Gets together: Not on file    Attends religious service: Not on file    Active member of club or organization: Not on file    Attends meetings of clubs or organizations: Not on file    Relationship status: Not on file  Other Topics Concern  . Not on file  Social History Narrative   Drinks 1 cup of coffee 3 days per week.    Family History  Problem Relation Age of Onset  . Diabetes Mother   . Hyperlipidemia Mother   . Hypertension Mother   . Diabetes Father   . Hyperlipidemia Father   . Hypertension Father   . Leukemia Maternal Grandfather   . Cancer Maternal Grandfather   . Parkinson's disease Paternal Grandfather   . Stroke Paternal Grandfather   . Diabetes Paternal Grandfather   . Heart disease Paternal Grandfather   . Hyperlipidemia Paternal Grandfather   . Diabetes Sister   . Hypertension Sister   . Diabetes Maternal Grandmother   . Hyperlipidemia Maternal Grandmother   . Hypertension Maternal Grandmother   . Diabetes Paternal Grandmother   . Hyperlipidemia Paternal Grandmother   . Hypertension Paternal Grandmother   . Hypertension Sister   .  Migraines Neg Hx      ROS Review of Systems See HPI Constitution: No fevers or chills No malaise No diaphoresis Skin: No rash or itching Eyes: no blurry vision, no double vision GU: no dysuria or hematuria Neuro: no dizziness or headaches all others reviewed and negative   Objective: Vitals:   10/22/17 0848  BP: 110/74  Pulse: 87  Resp: 17  Temp: 98.6 F (37 C)  TempSrc: Oral  SpO2: 98%  Weight: 177 lb (80.3 kg)  Height: 5' 4.37" (1.635 m)    Physical Exam  Constitutional: She appears well-developed and well-nourished.  HENT:  Head: Normocephalic and atraumatic.  Cardiovascular: Normal rate.  Abdominal: Normal appearance and bowel sounds are normal. She exhibits no shifting dullness, no distension, no pulsatile liver, no fluid wave, no abdominal bruit, no ascites, no pulsatile midline mass and no  mass. There is no tenderness.    Assessment and Plan Martie LeeSabrina was seen today for abdominal pain.  Diagnoses and all orders for this visit:  Chronic gastritis without bleeding, unspecified gastritis type Gastroesophageal reflux disease, esophagitis presence not specified  -  Continue protonix as needed Follow up with GI to establish care so that if she gets exacerbations and worsening pain she will be able to be seen for follow up At this point she should continue her regimen and continue dietary modifications   Yoni Lobos A Creta LevinStallings

## 2017-10-22 NOTE — Patient Instructions (Addendum)
   IF you received an x-ray today, you will receive an invoice from [facility]. Please contact [facility] at [phone] with questions or concerns regarding your invoice.   IF you received labwork today, you will receive an invoice from [facility]. Please contact [facility] at [phone] with questions or concerns regarding your invoice.   Our billing staff will not be able to assist you with questions regarding bills from these companies.  You will be contacted with the lab results as soon as they are available. The fastest way to get your results is to activate your My Chart account. Instructions are located on the last page of this paperwork. If you have not heard from us regarding the results in 2 weeks, please contact this office.     Gastroesophageal Reflux Disease, Adult Normally, food travels down the esophagus and stays in the stomach to be digested. However, when a person has gastroesophageal reflux disease (GERD), food and stomach acid move back up into the esophagus. When this happens, the esophagus becomes sore and inflamed. Over time, GERD can create small holes (ulcers) in the lining of the esophagus. What are the causes? This condition is caused by a problem with the muscle between the esophagus and the stomach (lower esophageal sphincter, or LES). Normally, the LES muscle closes after food passes through the esophagus to the stomach. When the LES is weakened or abnormal, it does not close properly, and that allows food and stomach acid to go back up into the esophagus. The LES can be weakened by certain dietary substances, medicines, and medical conditions, including:  Tobacco use.  Pregnancy.  Having a hiatal hernia.  Heavy alcohol use.  Certain foods and beverages, such as coffee, chocolate, onions, and peppermint.  What increases the risk? This condition is more likely to develop in:  People who have an increased body weight.  People who have  connective tissue disorders.  People who use NSAID medicines.  What are the signs or symptoms? Symptoms of this condition include:  Heartburn.  Difficult or painful swallowing.  The feeling of having a lump in the throat.  Abitter taste in the mouth.  Bad breath.  Having a large amount of saliva.  Having an upset or bloated stomach.  Belching.  Chest pain.  Shortness of breath or wheezing.  Ongoing (chronic) cough or a night-time cough.  Wearing away of tooth enamel.  Weight loss.  Different conditions can cause chest pain. Make sure to see your health care provider if you experience chest pain. How is this diagnosed? Your health care provider will take a medical history and perform a physical exam. To determine if you have mild or severe GERD, your health care provider may also monitor how you respond to treatment. You may also have other tests, including:  An endoscopy toexamine your stomach and esophagus with a small camera.  A test thatmeasures the acidity level in your esophagus.  A test thatmeasures how much pressure is on your esophagus.  A barium swallow or modified barium swallow to show the shape, size, and functioning of your esophagus.  How is this treated? The goal of treatment is to help relieve your symptoms and to prevent complications. Treatment for this condition may vary depending on how severe your symptoms are. Your health care provider may recommend:  Changes to your diet.  Medicine.  Surgery.  Follow these instructions at home: Diet  Follow a diet as recommended by your health care provider. This may involve   avoiding foods and drinks such as: ? Coffee and tea (with or without caffeine). ? Drinks that containalcohol. ? Energy drinks and sports drinks. ? Carbonated drinks or sodas. ? Chocolate and cocoa. ? Peppermint and mint flavorings. ? Garlic and onions. ? Horseradish. ? Spicy and acidic foods, including peppers, chili  powder, curry powder, vinegar, hot sauces, and barbecue sauce. ? Citrus fruit juices and citrus fruits, such as oranges, lemons, and limes. ? Tomato-based foods, such as red sauce, chili, salsa, and pizza with red sauce. ? Fried and fatty foods, such as donuts, french fries, potato chips, and high-fat dressings. ? High-fat meats, such as hot dogs and fatty cuts of red and white meats, such as rib eye steak, sausage, ham, and bacon. ? High-fat dairy items, such as whole milk, butter, and cream cheese.  Eat small, frequent meals instead of large meals.  Avoid drinking large amounts of liquid with your meals.  Avoid eating meals during the 2-3 hours before bedtime.  Avoid lying down right after you eat.  Do not exercise right after you eat. General instructions  Pay attention to any changes in your symptoms.  Take over-the-counter and prescription medicines only as told by your health care provider. Do not take aspirin, ibuprofen, or other NSAIDs unless your health care provider told you to do so.  Do not use any tobacco products, including cigarettes, chewing tobacco, and e-cigarettes. If you need help quitting, ask your health care provider.  Wear loose-fitting clothing. Do not wear anything tight around your waist that causes pressure on your abdomen.  Raise (elevate) the head of your bed 6 inches (15cm).  Try to reduce your stress, such as with yoga or meditation. If you need help reducing stress, ask your health care provider.  If you are overweight, reduce your weight to an amount that is healthy for you. Ask your health care provider for guidance about a safe weight loss goal.  Keep all follow-up visits as told by your health care provider. This is important. Contact a health care provider if:  You have new symptoms.  You have unexplained weight loss.  You have difficulty swallowing, or it hurts to swallow.  You have wheezing or a persistent cough.  Your symptoms do not  improve with treatment.  You have a hoarse voice. Get help right away if:  You have pain in your arms, neck, jaw, teeth, or back.  You feel sweaty, dizzy, or light-headed.  You have chest pain or shortness of breath.  You vomit and your vomit looks like blood or coffee grounds.  You faint.  Your stool is bloody or black.  You cannot swallow, drink, or eat. This information is not intended to replace advice given to you by your health care provider. Make sure you discuss any questions you have with your health care provider. Document Released: 04/17/2005 Document Revised: 12/06/2015 Document Reviewed: 11/02/2014 Elsevier Interactive Patient Education  2018 Elsevier Inc.  

## 2017-10-28 ENCOUNTER — Encounter: Payer: Self-pay | Admitting: Gastroenterology

## 2017-10-28 ENCOUNTER — Other Ambulatory Visit (INDEPENDENT_AMBULATORY_CARE_PROVIDER_SITE_OTHER): Payer: 59

## 2017-10-28 ENCOUNTER — Ambulatory Visit: Payer: 59 | Admitting: Gastroenterology

## 2017-10-28 VITALS — BP 104/74 | HR 80 | Ht 64.0 in | Wt 177.0 lb

## 2017-10-28 DIAGNOSIS — R12 Heartburn: Secondary | ICD-10-CM

## 2017-10-28 DIAGNOSIS — R1013 Epigastric pain: Secondary | ICD-10-CM

## 2017-10-28 DIAGNOSIS — K219 Gastro-esophageal reflux disease without esophagitis: Secondary | ICD-10-CM

## 2017-10-28 DIAGNOSIS — K5909 Other constipation: Secondary | ICD-10-CM | POA: Diagnosis not present

## 2017-10-28 DIAGNOSIS — R112 Nausea with vomiting, unspecified: Secondary | ICD-10-CM

## 2017-10-28 DIAGNOSIS — R140 Abdominal distension (gaseous): Secondary | ICD-10-CM | POA: Diagnosis not present

## 2017-10-28 LAB — IGA: IGA: 190 mg/dL (ref 68–378)

## 2017-10-28 NOTE — Progress Notes (Signed)
Catherine Downs    [id number]    [birth year]  Primary Care Physician:Stallings, Manus Rudd, MD  Referring Physician: Doristine Bosworth, MD [address] [place], [place] [address]  Chief complaint: GERD, abdominal pain, nausea  HPI: 32 year-old female is here for new patient visit with complaints of intermittent severe abdominal pain in the epigastric region radiating to the chest, nausea and reflux symptoms. She has had acid reflux past 1-2 years but feels it is worse in the past 2 months, with severe pain in the chest and epigastric region, almost feels like stabbing.  She had an intense episode Super Bowl weekend, she thinks it is due to the heavy greasy food she ate, she was vomiting multiple times and also had some diarrhea .   She got started on Protonix by PMD. H. Pylori breath test negative. She was taking Ibuprofen regularly 2-3 times a week 2 months, stopped after seeing PMD Continues to have intermittent heartburn with episodes about once a week but not as severe they used to be No odynophagia, dysphagia, nausea, vomting, abdominal pain,  Bowel habits back to regular, no constipation or diarrhea but has abdominal bloating She has been drinking celery juice and feels that is helping with her symptoms and making her bowel habits regular Denies any loss of appetite or weight loss No family history of GI malignancy Has family history of?Ulcerative colitis in Paternal grandmother    Outpatient Encounter Medications as of 10/28/2017  Medication Sig  . pantoprazole (PROTONIX) 40 MG tablet Take 1 tablet (40 mg total) by mouth daily.   Facility-Administered Encounter Medications as of 10/28/2017  Medication  . ondansetron (ZOFRAN-ODT) disintegrating tablet 8 mg    Allergies as of 10/28/2017 - Review Complete 10/28/2017  Allergen Reaction Noted  . Penicillins Swelling 11/03/2012    Past Medical History:  Diagnosis Date  . Anxiety   . Chronic headaches   . Depression   .  Ulcer jejunum     Past Surgical History:  Procedure Laterality Date  . NO PAST SURGERIES      Family History  Problem Relation Age of Onset  . Diabetes Mother   . Hyperlipidemia Mother   . Hypertension Mother   . Diabetes Father   . Hyperlipidemia Father   . Hypertension Father   . Leukemia Maternal Grandfather   . Parkinson's disease Paternal Grandfather   . Stroke Paternal Grandfather   . Diabetes Paternal Grandfather   . Heart disease Paternal Grandfather   . Hyperlipidemia Paternal Grandfather   . Colon polyps Paternal Grandfather   . Diabetes Sister   . Hypertension Sister   . Diabetes Maternal Grandmother   . Hyperlipidemia Maternal Grandmother   . Hypertension Maternal Grandmother   . Diabetes Paternal Grandmother   . Hyperlipidemia Paternal Grandmother   . Hypertension Paternal Grandmother   . Colon polyps Paternal Grandmother   . Ulcerative colitis Paternal Grandmother   . Hypertension Sister   . Migraines Neg Hx     Social History   Socioeconomic History  . Marital status: Unknown    Spouse name: Not on file  . Number of children: Not on file  . Years of education: Not on file  . Highest education level: Not on file  Occupational History  . Occupation: Doctor, general practice: [employer]  Social Needs  . Financial resource strain: Not on file  . Food insecurity:    Worry: Not on file  Inability: Not on file  . Transportation needs:    Medical: Not on file    Non-medical: Not on file  Tobacco Use  . Smoking status: Never Smoker  . Smokeless tobacco: Never Used  Substance and Sexual Activity  . Alcohol use: Yes    Alcohol/week: 0.0 oz    Comment: 1 per day  . Drug use: No  . Sexual activity: Not on file  Lifestyle  . Physical activity:    Days per week: Not on file    Minutes per session: Not on file  . Stress: Not on file  Relationships  . Social connections:    Talks on phone: Not on file    Gets together: Not on file     Attends religious service: Not on file    Active member of club or organization: Not on file    Attends meetings of clubs or organizations: Not on file    Relationship status: Not on file  . Intimate partner violence:    Fear of current or ex partner: Not on file    Emotionally abused: Not on file    Physically abused: Not on file    Forced sexual activity: Not on file  Other Topics Concern  . Not on file  Social History Narrative   Drinks 1 cup of coffee 3 days per week.      Review of systems: Review of Systems  Constitutional: Negative for fever and chills. Positive for fatigue HENT: Negative.   Eyes: Negative for blurred vision.  Respiratory: Negative for cough, shortness of breath and wheezing.   Cardiovascular: Negative for chest pain and palpitations.  Gastrointestinal: as per HPI Genitourinary: Negative for dysuria, urgency, frequency and hematuria.  Musculoskeletal: Negative for back pain and joint pain. Positive for muscle cramps Skin: Negative for itching and rash.  Neurological: Negative for dizziness, tremors, focal weakness, seizures and loss of consciousness. Positive for headaches. Endo/Heme/Allergies: Positive for seasonal allergies.  Psychiatric/Behavioral: Negative for depression, suicidal ideas and hallucinations. Positive for sleeping problems All other systems reviewed and are negative.   Physical Exam: Vitals:   10/28/17 0818  BP: 104/74  Pulse: 80   Body mass index is 30.38 kg/m. Gen:      No acute distress HEENT:  EOMI, sclera anicteric Neck:     No masses; no thyromegaly Lungs:    Clear to auscultation bilaterally; normal respiratory effort CV:         Regular rate and rhythm; no murmurs Abd:      + bowel sounds; soft, non-tender; no palpable masses, no distension Ext:    No edema; adequate peripheral perfusion Skin:      Warm and dry; no rash Neuro: alert and oriented x 3 Psych: normal mood and affect  Data Reviewed:  Reviewed labs,  radiology imaging, old records and pertinent past GI work up   Assessment and Plan/Recommendations:  32 year old female with history of GERD here with complaints of intermittent heartburn and severe epigastric pain radiating to the chest, also has intermittent episodes of nausea, vomiting and irregular bowel habits with constipation and diarrhea.   H. pylori breath test negative  Schedule for EGD to exclude peptic ulcer disease, severe gastritis or esophagitis Continue Protonix daily before breakfast Antireflux measures and lifestyle modification Continue to avoid NSAIDs  Abdominal ultrasound to exclude gallbladder disease  Intermittent abdominal bloating Check TTG IgA antibody and total IgA level to exclude celiac disease  Constipation: Continue high-fiber diet with increased fluid intake  The  risks and benefits as well as alternatives of endoscopic procedure(s) have been discussed and reviewed. All questions answered. The patient agrees to proceed.  Iona Beard , MD [phone]    CC: Doristine Bosworth, MD

## 2017-10-28 NOTE — Patient Instructions (Signed)
You have been scheduled for an abdominal ultrasound [facility] of [facility]) on 10/31/2017 at 9:30am. Please arrive 15 minutes prior to your appointment for registration. Make certain not to have anything to eat or drink 6 hours prior to your appointment. Should you need to reschedule your appointment, please contact radiology at [phone]. This test typically takes about 30 minutes to perform.  You have been scheduled for an endoscopy. Please follow written instructions given to you at your visit today. If you use inhalers (even only as needed), please bring them with you on the day of your procedure.   Go to the basement today for labs   If you are age 31 or older, your body mass index should be between 23-30. Your Body mass index is 30.38 kg/m. If this is out of the aforementioned range listed, please consider follow up with your Primary Care Provider.  If you are age 31 or younger, your body mass index should be between 19-25. Your Body mass index is 30.38 kg/m. If this is out of the aformentioned range listed, please consider follow up with your Primary Care Provider.

## 2017-10-29 ENCOUNTER — Encounter: Payer: Self-pay | Admitting: Gastroenterology

## 2017-10-30 LAB — TISSUE TRANSGLUTAMINASE ABS,IGG,IGA
(tTG) Ab, IgA: 1 U/mL
(tTG) Ab, IgG: 2 U/mL

## 2017-10-31 ENCOUNTER — Encounter: Payer: Self-pay | Admitting: Gastroenterology

## 2017-10-31 ENCOUNTER — Ambulatory Visit (HOSPITAL_COMMUNITY)
Admission: RE | Admit: 2017-10-31 | Discharge: 2017-10-31 | Disposition: A | Discharge status: Home / Self Care | Payer: 59 | Source: Ambulatory Visit | Attending: Gastroenterology | Admitting: Gastroenterology

## 2017-10-31 DIAGNOSIS — K219 Gastro-esophageal reflux disease without esophagitis: Secondary | ICD-10-CM | POA: Diagnosis not present

## 2017-10-31 DIAGNOSIS — R12 Heartburn: Secondary | ICD-10-CM

## 2017-10-31 DIAGNOSIS — R112 Nausea with vomiting, unspecified: Secondary | ICD-10-CM

## 2017-10-31 DIAGNOSIS — K824 Cholesterolosis of gallbladder: Secondary | ICD-10-CM | POA: Diagnosis not present

## 2017-10-31 DIAGNOSIS — R1013 Epigastric pain: Secondary | ICD-10-CM | POA: Insufficient documentation

## 2017-11-03 ENCOUNTER — Encounter: Payer: Self-pay | Admitting: Family Medicine

## 2017-11-11 ENCOUNTER — Encounter: Payer: Self-pay | Admitting: Gastroenterology

## 2017-11-11 ENCOUNTER — Ambulatory Visit (AMBULATORY_SURGERY_CENTER): Payer: 59 | Admitting: Gastroenterology

## 2017-11-11 VITALS — BP 108/77 | HR 84 | Temp 98.2°F | Resp 83 | Ht 64.0 in | Wt 177.0 lb

## 2017-11-11 DIAGNOSIS — K219 Gastro-esophageal reflux disease without esophagitis: Secondary | ICD-10-CM | POA: Diagnosis not present

## 2017-11-11 DIAGNOSIS — R1013 Epigastric pain: Secondary | ICD-10-CM | POA: Diagnosis present

## 2017-11-11 DIAGNOSIS — K2950 Unspecified chronic gastritis without bleeding: Secondary | ICD-10-CM | POA: Diagnosis not present

## 2017-11-11 MED ORDER — SODIUM CHLORIDE 0.9 % IV SOLN: 500.0000 mL | Freq: Once | INTRAVENOUS | Status: AC

## 2017-11-11 NOTE — Progress Notes (Signed)
Called to room to assist during endoscopic procedure.  Patient ID and intended procedure confirmed with present staff. Received instructions for my participation in the procedure from the performing physician.  

## 2017-11-11 NOTE — Op Note (Signed)
[facility] Patient Name: Catherine Downs Procedure Date: 11/11/2017 9:23 AM MRN: [id number] Endoscopist: Napoleon Form , MD Age: 32 Referring MD:  Date of Birth: 22-Jan-1986 Gender: Female Account #: [id number] Procedure:                Upper GI endoscopy Indications:              Epigastric abdominal pain, Dyspepsia Medicines:                Monitored Anesthesia Care Procedure:                Pre-Anesthesia Assessment:                           - Prior to the procedure, a History and Physical                            was performed, and patient medications and                            allergies were reviewed. The patient's tolerance of                            previous anesthesia was also reviewed. The risks                            and benefits of the procedure and the sedation                            options and risks were discussed with the patient.                            All questions were answered, and informed consent                            was obtained. Prior Anticoagulants: The patient has                            taken no previous anticoagulant or antiplatelet                            agents. ASA Grade Assessment: II - A patient with                            mild systemic disease. After reviewing the risks                            and benefits, the patient was deemed in                            satisfactory condition to undergo the procedure.                           After obtaining informed consent, the endoscope was  passed under direct vision. Throughout the                            procedure, the patient's blood pressure, pulse, and                            oxygen saturations were monitored continuously. The                            Endoscope was introduced through the mouth, and                            advanced to the second part of duodenum. The upper                            GI endoscopy  was accomplished without difficulty.                            The patient tolerated the procedure well. Scope In: Scope Out: Findings:                 Esophagogastric landmarks were identified: the                            Z-line was found at 35 cm and the gastroesophageal                            junction was found at 35 cm from the incisors.                           Striped mildly erythematous mucosa without bleeding                            was found in the gastric antrum. Biopsies were                            taken with a cold forceps for Helicobacter pylori                            testing.                           The first portion of the duodenum and second                            portion of the duodenum were normal. Biopsies were                            taken with a cold forceps for histology. Complications:            No immediate complications. Estimated Blood Loss:     Estimated blood loss was minimal. Impression:               - Esophagogastric landmarks identified.                           -  Erythematous mucosa in the antrum. Biopsied.                           - Normal first portion of the duodenum and second                            portion of the duodenum. Biopsied. Recommendation:           - Resume previous diet.                           - Continue present medications.                           - Await pathology results. Napoleon Form, MD 11/11/2017 9:53:00 AM This report has been signed electronically.

## 2017-11-11 NOTE — Patient Instructions (Signed)
YOU HAD AN ENDOSCOPIC PROCEDURE TODAY AT THE [facility]:   Refer to the procedure report that was given to you for any specific questions about what was found during the examination.  If the procedure report does not answer your questions, please call your gastroenterologist to clarify.  If you requested that your care partner not be given the details of your procedure findings, then the procedure report has been included in a sealed envelope for you to review at your convenience later.  YOU SHOULD EXPECT: Some feelings of bloating in the abdomen. Passage of more gas than usual.  Walking can help get rid of the air that was put into your GI tract during the procedure and reduce the bloating. If you had a lower endoscopy (such as a colonoscopy or flexible sigmoidoscopy) you may notice spotting of blood in your stool or on the toilet paper. If you underwent a bowel prep for your procedure, you may not have a normal bowel movement for a few days.  Please Note:  You might notice some irritation and congestion in your nose or some drainage.  This is from the oxygen used during your procedure.  There is no need for concern and it should clear up in a day or so.  SYMPTOMS TO REPORT IMMEDIATELY:   Following upper endoscopy (EGD)  Vomiting of blood or coffee ground material  New chest pain or pain under the shoulder blades  Painful or persistently difficult swallowing  New shortness of breath  Fever of 100F or higher  Black, tarry-looking stools  For urgent or emergent issues, a gastroenterologist can be reached at any hour by calling [phone].   DIET:  We do recommend a small meal at first, but then you may proceed to your regular diet.  Drink plenty of fluids but you should avoid alcoholic beverages for 24 hours.  ACTIVITY:  You should plan to take it easy for the rest of today and you should NOT DRIVE or use heavy machinery until tomorrow (because of the sedation medicines used  during the test).    FOLLOW UP: Our staff will call the number listed on your records the next business day following your procedure to check on you and address any questions or concerns that you may have regarding the information given to you following your procedure. If we do not reach you, we will leave a message.  However, if you are feeling well and you are not experiencing any problems, there is no need to return our call.  We will assume that you have returned to your regular daily activities without incident.  If any biopsies were taken you will be contacted by phone or by letter within the next 1-3 weeks.  Please call us at [phone] if you have not heard about the biopsies in 3 weeks.  Await for biopsy results  SIGNATURES/CONFIDENTIALITY: You and/or your care partner have signed paperwork which will be entered into your electronic medical record.  These signatures attest to the fact that that the information above on your After Visit Summary has been reviewed and is understood.  Full responsibility of the confidentiality of this discharge information lies with you and/or your care-partner. 

## 2017-11-11 NOTE — Progress Notes (Signed)
Pt's states no medical or surgical changes since previsit or office visit. 

## 2017-11-11 NOTE — Progress Notes (Signed)
To Pacu, VSS. Report to [email or web address] 

## 2017-11-12 ENCOUNTER — Telehealth: Payer: Self-pay

## 2017-11-12 NOTE — Telephone Encounter (Signed)
Left message

## 2017-11-12 NOTE — Telephone Encounter (Signed)
  Follow up Call-  Call back number 11/11/2017  Post procedure Call Back phone  # [phone]  Permission to leave phone message Yes  Some recent data might be hidden     Patient questions:  Do you have a fever, pain , or abdominal swelling? No. Pain Score  0 *  Have you tolerated food without any problems? Yes.    Have you been able to return to your normal activities? Yes.    Do you have any questions about your discharge instructions: Diet   No. Medications  No. Follow up visit  No.  Do you have questions or concerns about your Care? No.  Actions: * If pain score is 4 or above: No action needed, pain <4.

## 2017-11-21 ENCOUNTER — Encounter: Payer: Self-pay | Admitting: Gastroenterology

## 2018-04-27 ENCOUNTER — Telehealth: Payer: Self-pay

## 2018-04-27 NOTE — Telephone Encounter (Signed)
Left message to call us back to have the abdominal u/s scheduled.  The gallbladder polyp needs surveillance to make sure it is not growing in size. Last u/s done 10/31/17.

## 2018-05-08 ENCOUNTER — Other Ambulatory Visit: Payer: Self-pay

## 2018-05-08 NOTE — Telephone Encounter (Signed)
Letter to the patient. 

## 2018-12-25 ENCOUNTER — Encounter: Payer: 59 | Admitting: Family Medicine

## 2019-02-01 ENCOUNTER — Telehealth: Payer: Self-pay | Admitting: Family Medicine

## 2019-02-01 NOTE — Telephone Encounter (Signed)
Pt does not want to come in Office , still wants to do virtual for physical . Pt would like to here back from some one [phone]   wants to make virtual FR

## 2019-02-02 ENCOUNTER — Telehealth: Payer: 59 | Admitting: Family Medicine

## 2019-02-06 NOTE — Telephone Encounter (Signed)
Spoke with pt and she will call office to reschedule appt. Dgaddy, CMA

## 2019-04-12 ENCOUNTER — Encounter: Payer: Self-pay | Admitting: Family Medicine

## 2019-04-12 ENCOUNTER — Other Ambulatory Visit (HOSPITAL_COMMUNITY)
Admission: RE | Admit: 2019-04-12 | Discharge: 2019-04-12 | Disposition: A | Discharge status: Home / Self Care | Payer: 59 | Source: Ambulatory Visit | Attending: Family Medicine | Admitting: Family Medicine

## 2019-04-12 ENCOUNTER — Other Ambulatory Visit: Payer: Self-pay | Admitting: Family Medicine

## 2019-04-12 ENCOUNTER — Ambulatory Visit: Payer: 59 | Admitting: Family Medicine

## 2019-04-12 ENCOUNTER — Other Ambulatory Visit: Payer: Self-pay

## 2019-04-12 ENCOUNTER — Telehealth: Payer: 59 | Admitting: Family Medicine

## 2019-04-12 VITALS — BP 127/87 | HR 87 | Temp 98.8°F | Resp 17 | Ht 64.0 in | Wt 180.0 lb

## 2019-04-12 DIAGNOSIS — Z124 Encounter for screening for malignant neoplasm of cervix: Secondary | ICD-10-CM | POA: Diagnosis not present

## 2019-04-12 DIAGNOSIS — Z13220 Encounter for screening for lipoid disorders: Secondary | ICD-10-CM

## 2019-04-12 DIAGNOSIS — A64 Unspecified sexually transmitted disease: Secondary | ICD-10-CM | POA: Insufficient documentation

## 2019-04-12 DIAGNOSIS — Z299 Encounter for prophylactic measures, unspecified: Secondary | ICD-10-CM

## 2019-04-12 DIAGNOSIS — Z114 Encounter for screening for human immunodeficiency virus [HIV]: Secondary | ICD-10-CM

## 2019-04-12 DIAGNOSIS — Z0000 Encounter for general adult medical examination without abnormal findings: Secondary | ICD-10-CM

## 2019-04-12 DIAGNOSIS — Z23 Encounter for immunization: Secondary | ICD-10-CM

## 2019-04-12 DIAGNOSIS — Z975 Presence of (intrauterine) contraceptive device: Secondary | ICD-10-CM

## 2019-04-12 DIAGNOSIS — Z0001 Encounter for general adult medical examination with abnormal findings: Secondary | ICD-10-CM | POA: Diagnosis not present

## 2019-04-12 DIAGNOSIS — N920 Excessive and frequent menstruation with regular cycle: Secondary | ICD-10-CM

## 2019-04-12 NOTE — Patient Instructions (Signed)
° ° ° °  If you have lab work done today you will be contacted with your lab results within the next 2 weeks.  If you have not heard from us then please contact us. The fastest way to get your results is to register for My Chart. ° ° °IF you received an x-ray today, you will receive an invoice from [facility]. Please contact  Radiology at [phone] with questions or concerns regarding your invoice.  ° °IF you received labwork today, you will receive an invoice from [facility]. Please contact [facility] at [phone] with questions or concerns regarding your invoice.  ° °Our billing staff will not be able to assist you with questions regarding bills from these companies. ° °You will be contacted with the lab results as soon as they are available. The fastest way to get your results is to activate your My Chart account. Instructions are located on the last page of this paperwork. If you have not heard from us regarding the results in 2 weeks, please contact this office. °  ° ° ° °

## 2019-04-12 NOTE — Progress Notes (Signed)
Chief Complaint  Patient presents with  . Annual Exam    cpe with pap, declines flu, pt c/o crawling sensation in scalp and arms x 2 months and its very creepy feeling and is concerned    Subjective:  [name] is a 33 y.o. female here for a health maintenance visit.  Patient is established pt  Heavy menses She is currently using the paraguard for contraception She has very heavy bleeding and reports that she gets her cycle monthly Her paraguard was placed in 2014 She takes a multivitamin  She has a sensation like something is crawling in her scalp and her hair and skin of her arms She does not have any bites or see anything   Patient Active Problem List   Diagnosis Date Noted  . Post concussion syndrome 08/03/2015    Past Medical History:  Diagnosis Date  . Anxiety   . Chronic headaches   . Depression   . Ulcer jejunum     Past Surgical History:  Procedure Laterality Date  . NO PAST SURGERIES       Outpatient Medications Prior to Visit  Medication Sig Dispense Refill  . pantoprazole (PROTONIX) 40 MG tablet Take 1 tablet (40 mg total) by mouth daily. 30 tablet 3   Facility-Administered Medications Prior to Visit  Medication Dose Route Frequency Provider Last Rate Last Dose  . 0.9 %  sodium chloride infusion  500 mL Intravenous Once Nandigam, Kavitha V, MD      . ondansetron (ZOFRAN-ODT) disintegrating tablet 8 mg  8 mg Oral Once Godfrey PickEgan, Eleanore E, PA-C        Allergies  Allergen Reactions  . Penicillins Swelling     Family History  Problem Relation Age of Onset  . Diabetes Mother   . Hyperlipidemia Mother   . Hypertension Mother   . Diabetes Father   . Hyperlipidemia Father   . Hypertension Father   . Leukemia Maternal Grandfather   . Parkinson's disease Paternal Grandfather   . Stroke Paternal Grandfather   . Diabetes Paternal Grandfather   . Heart disease Paternal Grandfather   . Hyperlipidemia Paternal Grandfather   . Colon polyps Paternal  Grandfather   . Diabetes Sister   . Hypertension Sister   . Diabetes Maternal Grandmother   . Hyperlipidemia Maternal Grandmother   . Hypertension Maternal Grandmother   . Diabetes Paternal Grandmother   . Hyperlipidemia Paternal Grandmother   . Hypertension Paternal Grandmother   . Colon polyps Paternal Grandmother   . Ulcerative colitis Paternal Grandmother   . Hypertension Sister   . Migraines Neg Hx      Health Habits: Dental Exam: not up to date Eye Exam: up to date Exercise: 2 times/week on average Current exercise activities: walking/running Diet: balanced  Social History   Socioeconomic History  . Marital status: Married    Spouse name: Not on file  . Number of children: Not on file  . Years of education: Not on file  . Highest education level: Not on file  Occupational History  . 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 Financial resource strain: Not on file  . Food insecurity    Worry: Not on file    Inability: Not on file  . Transportation needs    Medical: Not on file    Non-medical: Not on file  Tobacco Use  . Smoking status: Never Smoker  . Smokeless tobacco: Never Used  Substance and Sexual Activity  .  Alcohol use: Yes    Alcohol/week: 0.0 standard drinks    Comment: 1 per day  . Drug use: No  . Sexual activity: Not on file  Lifestyle  . Physical activity    Days per week: Not on file    Minutes per session: Not on file  . Stress: Not on file  Relationships  . Social Musician on phone: Not on file    Gets together: Not on file    Attends religious service: Not on file    Active member of club or organization: Not on file    Attends meetings of clubs or organizations: Not on file    Relationship status: Not on file  . Intimate partner violence    Fear of current or ex partner: Not on file    Emotionally abused: Not on file    Physically abused: Not on file    Forced sexual activity: Not on file   Other Topics Concern  . Not on file  Social History Narrative   Drinks 1 cup of coffee 3 days per week.   Social History   Substance and Sexual Activity  Alcohol Use Yes  . Alcohol/week: 0.0 standard drinks   Comment: 1 per day   Social History   Tobacco Use  Smoking Status Never Smoker  Smokeless Tobacco Never Used   Social History   Substance and Sexual Activity  Drug Use No    GYN: Sexual Health Menstrual status: regular menses LMP: Patient's last menstrual period was 04/07/2019. Last pap smear: see HM section History of abnormal pap smears:  Sexually active:  with female partner Current contraception: IUD PARAGUARD  Health Maintenance: See under health Maintenance activity for review of completion dates as well. Immunization History  Administered Date(s) Administered  . HPV Quadrivalent 04/12/2019      Depression Screen-PHQ2/9 Depression screen [facility] 2/9 04/12/2019 10/22/2017 09/01/2017  Decreased Interest 0 0 0  Down, Depressed, Hopeless 0 0 0  PHQ - 2 Score 0 0 0       Depression Severity and Treatment Recommendations:  0-4= None  5-9= Mild / Treatment: Support, educate to call if worse; return in one month  10-14= Moderate / Treatment: Support, watchful waiting; Antidepressant or Psycotherapy  15-19= Moderately severe / Treatment: Antidepressant OR Psychotherapy  >= 20 = Major depression, severe / Antidepressant AND Psychotherapy    Review of Systems   ROS  See HPI for ROS as well.  Review of Systems  Constitutional: Negative for activity change, appetite change, chills and fever.  HENT: Negative for congestion, nosebleeds, trouble swallowing and voice change.   Respiratory: Negative for cough, shortness of breath and wheezing.   Gastrointestinal: Negative for diarrhea, nausea and vomiting.  Genitourinary: Negative for difficulty urinating, dysuria, flank pain and hematuria.  Musculoskeletal: Negative for back pain, joint swelling and neck pain.   Neurological: Negative for dizziness, speech difficulty, light-headedness and numbness.  See HPI. All other review of systems negative.    Objective:   Vitals:   04/12/19 1036  BP: 127/87  Pulse: 87  Resp: 17  Temp: 98.8 F (37.1 C)  TempSrc: Oral  SpO2: 99%  Weight: 180 lb (81.6 kg)  Height: 5\' 4"  (1.626 m)    Body mass index is 30.9 kg/m.  Physical Exam    BP 127/87 (BP Location: Left Arm, Patient Position: Sitting, Cuff Size: Large)   Pulse 87   Temp 98.8 F (37.1 C) (Oral)   Resp 17  Ht 5\' 4"  (1.626 m)   Wt 180 lb (81.6 kg)   LMP 04/07/2019   SpO2 99%   BMI 30.90 kg/m   Chaperone present General Appearance:    Alert, cooperative, no distress, appears stated age  Head:    Normocephalic, without obvious abnormality, atraumatic   Eyes:    PERRL, conjunctiva/corneas clear, EOM's intact  Ears:    Normal TM's and external ear canals, both ears  Nose:   Nares normal, septum midline, mucosa normal, no drainage    or sinus tenderness  Throat:   Lips, mucosa, and tongue normal; teeth and gums normal  Neck:   Supple, symmetrical, trachea midline, no adenopathy;    thyroid:  no enlargement/tenderness/nodules  Back:     Symmetric, no curvature, ROM normal, no CVA tenderness  Lungs:     Clear to auscultation bilaterally, respirations unlabored  Chest Wall:    No tenderness or deformity   Heart:    Regular rate and rhythm, S1 and S2 normal, no murmur, rub   or gallop  Breast Exam:    No tenderness, masses, or nipple abnormality  Abdomen:     Soft, non-tender, bowel sounds active all four quadrants,    no masses, no organomegaly  Genitalia:    Normal female without lesion, discharge or tenderness, no palpable masses, period blood noted from the os, IUD STRINGS VISUALIZED, pap smear collected  Extremities:   Extremities normal, atraumatic, no cyanosis or edema  Pulses:   2+ and symmetric all extremities  Skin:   Skin color, texture, turgor normal, no rashes or lesions   Lymph nodes:   Cervical, supraclavicular, and axillary nodes normal  Neurologic:   CNII-XII intact, normal strength, sensation and reflexes    throughout    Assessment/Plan:   Patient was seen for a health maintenance exam.  Counseled the patient on health maintenance issues. Reviewed her health mainteance schedule and ordered appropriate tests (see orders.) Counseled on regular exercise and weight management. Recommend regular eye exams and dental cleaning.   The following issues were addressed today for health maintenance:   Martie LeeSabrina was seen today for annual exam.  Diagnoses and all orders for this visit:  Health maintenance examination- Women's Health Maintenance Plan Advised monthly breast exam and annual mammogram Advised dental exam every six months Discussed stress management Discussed pap smear screening guidelines  Need for prophylactic vaccination and inoculation against influenza  Screening for HIV (human immunodeficiency virus) -     HIV antibody (with reflex)  Sexually transmitted disease (STD)- declined std screening today -     Cancel: GC/Chlamydia Probe Amp([facility])  Encounter for Papanicolaou smear for cervical cancer screening- Discussed cervical cancer screening  As well as screening for HPV Discussed risk factors for cervical cancer  And hpv vaccination records reviewed  -     Cytology - PAP([place])  IUD (intrauterine device) in place- IUD strings visualized  Menorrhagia with regular cycle- will screen, sensation of crawling on skin could be due to anemia -     CBC -     TSH -     Basic metabolic panel -     Ferritin  Screening, lipid -     Lipid panel     Body mass index is 30.9 kg/m.:  Discussed the patient's BMI with patient. The BMI body mass index is 30.9 kg/m.     Future Appointments  Date Time Provider Department Center  05/12/2019 11:00 AM PCP-NURSE PCP-PCP [facility]    Patient Instructions  If you have lab work  done today you will be contacted with your lab results within the next 2 weeks.  If you have not heard from [place] then please contact us. The fastest way to get your results is to register for My Chart.   IF you received an x-ray today, you will receive an invoice from [facility]. Please contact [facility] at [phone] with questions or concerns regarding your invoice.   IF you received labwork today, you will receive an invoice from [facility]. Please contact [facility] at [phone] with questions or concerns regarding your invoice.   Our billing staff will not be able to assist you with questions regarding bills from these companies.  You will be contacted with the lab results as soon as they are available. The fastest way to get your results is to activate your My Chart account. Instructions are located on the last page of this paperwork. If you have not heard from [place] regarding the results in 2 weeks, please contact this office.

## 2019-04-13 LAB — LIPID PANEL
Chol/HDL Ratio: 3.4 ratio (ref 0.0–4.4)
Cholesterol, Total: 219 mg/dL — ABNORMAL HIGH (ref 100–199)
HDL: 64 mg/dL (ref >39)
LDL Chol Calc (NIH): 139 mg/dL — ABNORMAL HIGH (ref 0–99)
Triglycerides: 90 mg/dL (ref 0–149)
VLDL Cholesterol Cal: 16 mg/dL (ref 5–40)

## 2019-04-13 LAB — BASIC METABOLIC PANEL
BUN/Creatinine Ratio: 13 (ref 9–23)
BUN: 11 mg/dL (ref 6–20)
CO2: 22 mmol/L (ref 20–29)
Calcium: 10.1 mg/dL (ref 8.7–10.2)
Chloride: 102 mmol/L (ref 96–106)
Creatinine, Ser: 0.87 mg/dL (ref 0.57–1.00)
GFR calc Af Amer: 103 mL/min/{1.73_m2} (ref >59)
GFR calc non Af Amer: 89 mL/min/{1.73_m2} (ref >59)
Glucose: 90 mg/dL (ref 65–99)
Potassium: 4.3 mmol/L (ref 3.5–5.2)
Sodium: 140 mmol/L (ref 134–144)

## 2019-04-13 LAB — CBC
Hematocrit: 39.7 % (ref 34.0–46.6)
Hemoglobin: 13.4 g/dL (ref 11.1–15.9)
MCH: 28.7 pg (ref 26.6–33.0)
MCHC: 33.8 g/dL (ref 31.5–35.7)
MCV: 85 fL (ref 79–97)
Platelets: 323 10*3/uL (ref 150–450)
RBC: 4.67 x10E6/uL (ref 3.77–5.28)
RDW: 13.4 % (ref 11.7–15.4)
WBC: 6.6 10*3/uL (ref 3.4–10.8)

## 2019-04-13 LAB — TSH: TSH: 1.97 u[IU]/mL (ref 0.450–4.500)

## 2019-04-13 LAB — FERRITIN: Ferritin: 22 ng/mL (ref 15–150)

## 2019-04-13 LAB — HIV ANTIBODY (ROUTINE TESTING W REFLEX): HIV Screen 4th Generation wRfx: NONREACTIVE

## 2019-04-14 LAB — URINE CYTOLOGY ANCILLARY ONLY
Chlamydia: NEGATIVE
Neisseria Gonorrhea: NEGATIVE

## 2019-04-14 LAB — CYTOLOGY - PAP: Diagnosis: NEGATIVE

## 2019-05-12 ENCOUNTER — Ambulatory Visit: Payer: 59

## 2019-09-26 ENCOUNTER — Ambulatory Visit: Payer: 59 | Attending: Internal Medicine

## 2019-09-26 DIAGNOSIS — Z23 Encounter for immunization: Secondary | ICD-10-CM

## 2019-09-26 NOTE — Progress Notes (Signed)
   Covid-19 Vaccination Clinic  Name:  Catherine Downs    MRN: [id number] DOB: 1986/05/06  09/26/2019  Ms. Catherine Downs was observed post Covid-19 immunization for 15 minutes without incident. She was provided with Vaccine Information Sheet and instruction to access the V-Safe system.   Ms. Catherine Downs was instructed to call 911 with any severe reactions post vaccine: Marland Kitchen Difficulty breathing  . Swelling of face and throat  . A fast heartbeat  . A bad rash all over body  . Dizziness and weakness   Immunizations Administered    Name Date Dose VIS Date Route   Pfizer COVID-19 Vaccine 09/26/2019  4:38 PM 0.3 mL 07/02/2019 Intramuscular   Manufacturer: [employer], [employer]   Lot: [id number]   NDC: [address]-8

## 2019-10-17 IMAGING — US US ABDOMEN COMPLETE
1 series · 14 of 25 positions shown · non-contrast
Comparison: None.

CLINICAL DATA: Abdominal pain for 2 months.

EXAM:
ABDOMEN ULTRASOUND COMPLETE

[Series 1: us abdomen complete · 14 of 106 slices shown]
[im 1/106]
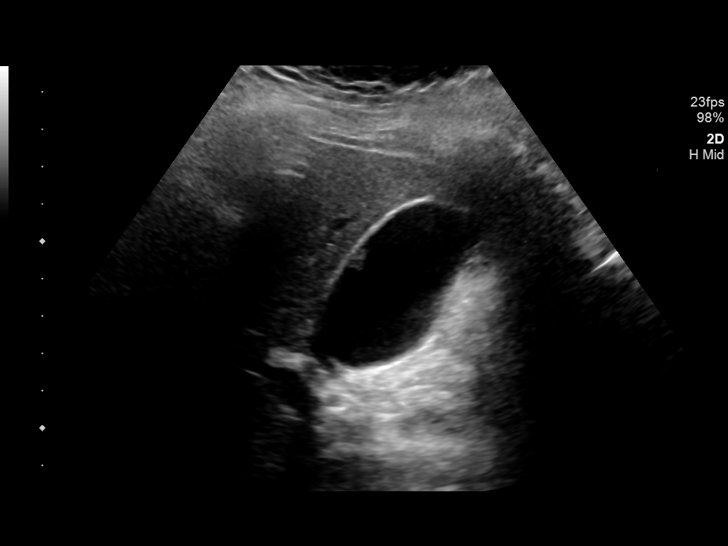
[im 9/106]
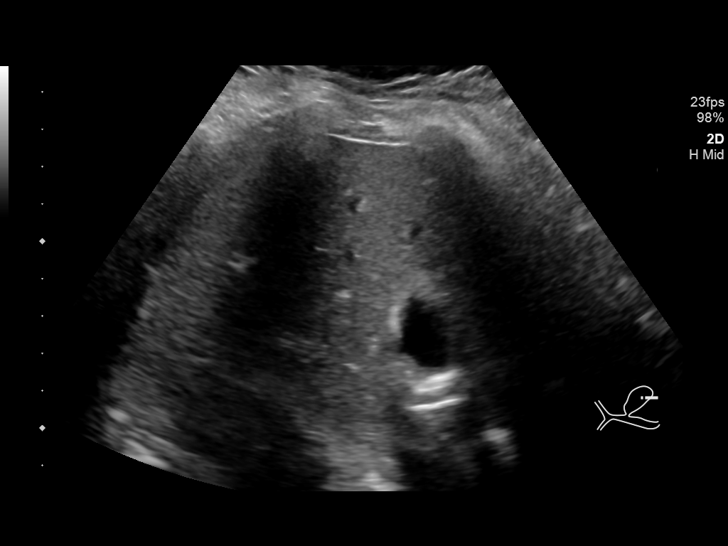
[im 18/106]
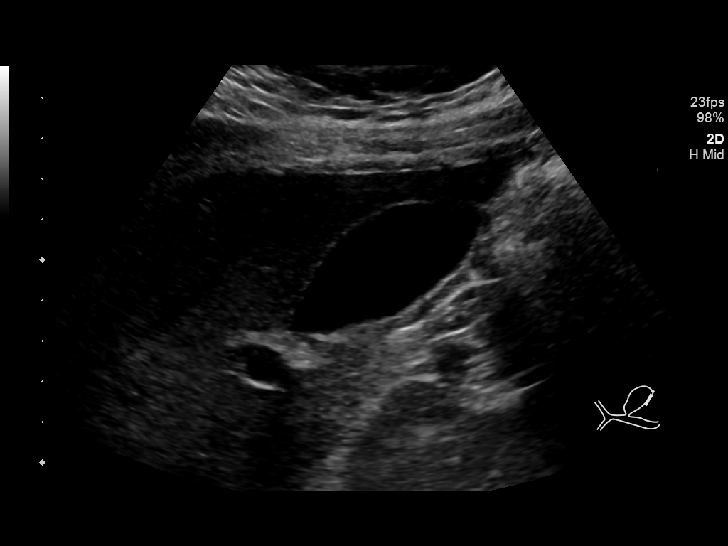
[im 27/106]
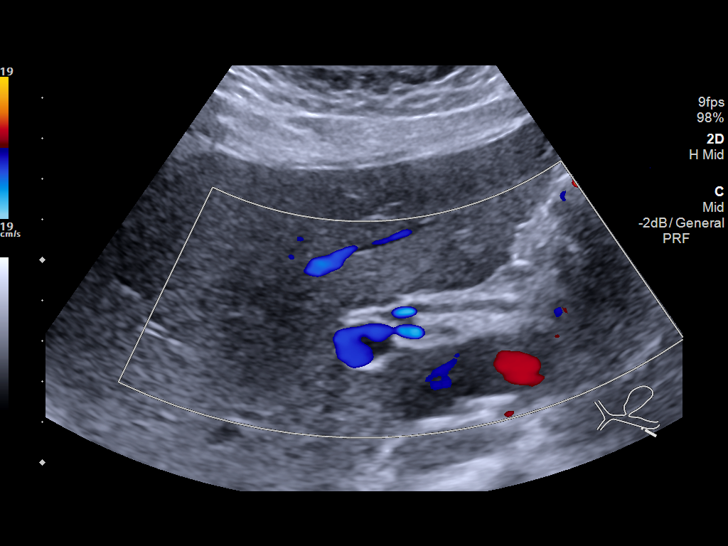
[im 36/106]
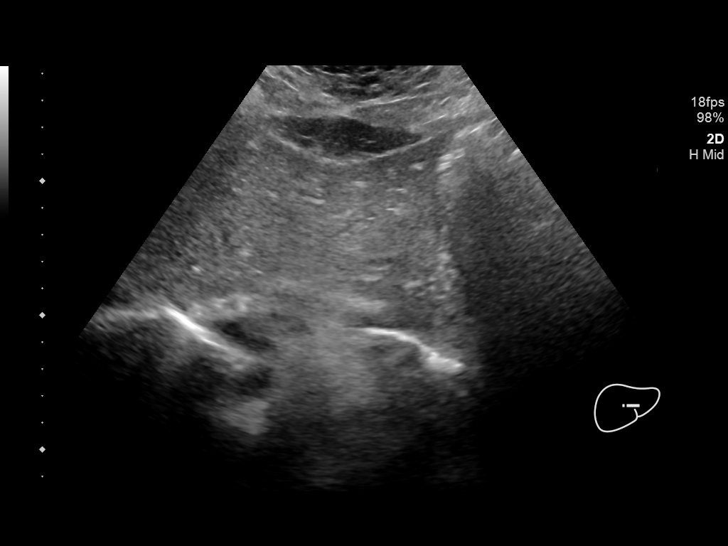
[im 40/106]
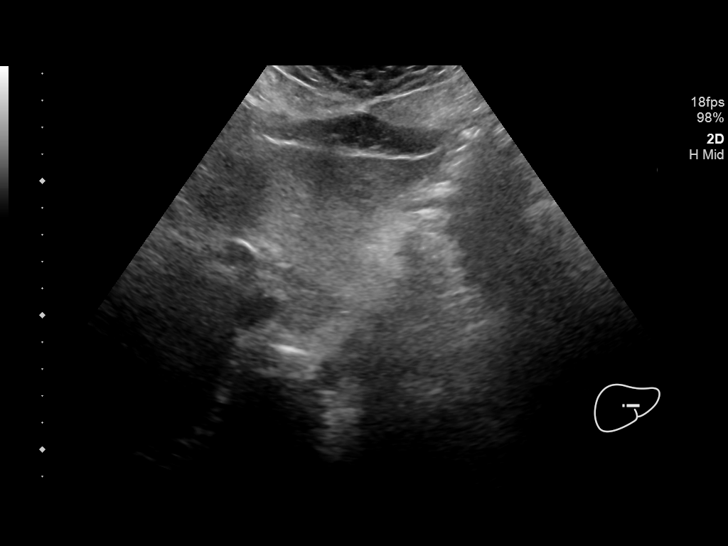
[im 49/106]
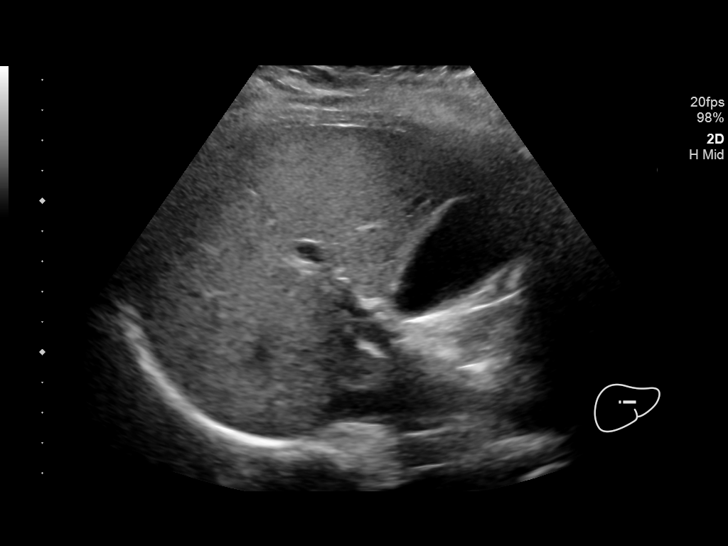
[im 57/106]
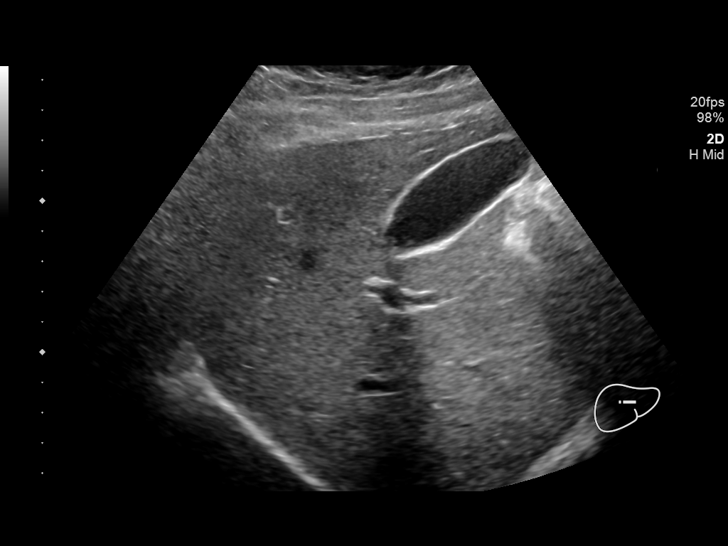
[im 66/106]
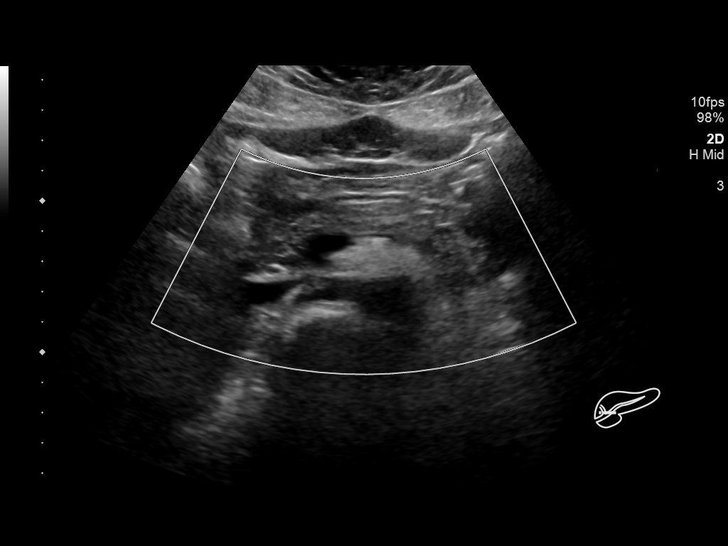
[im 71/106]
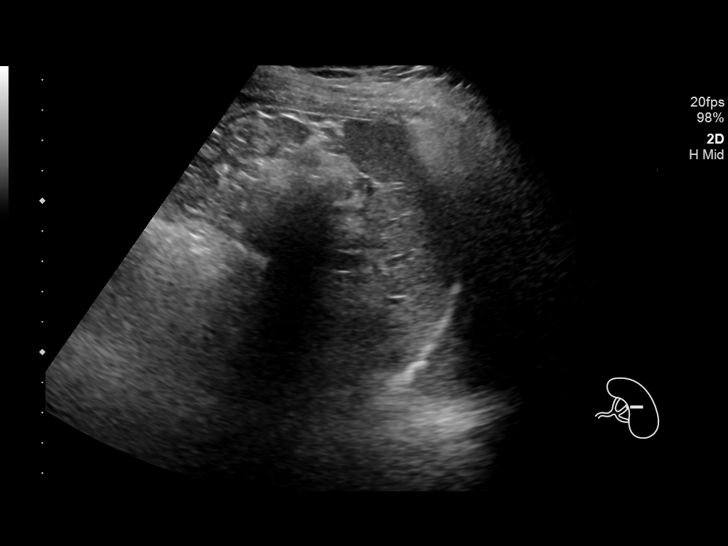
[im 79/106]
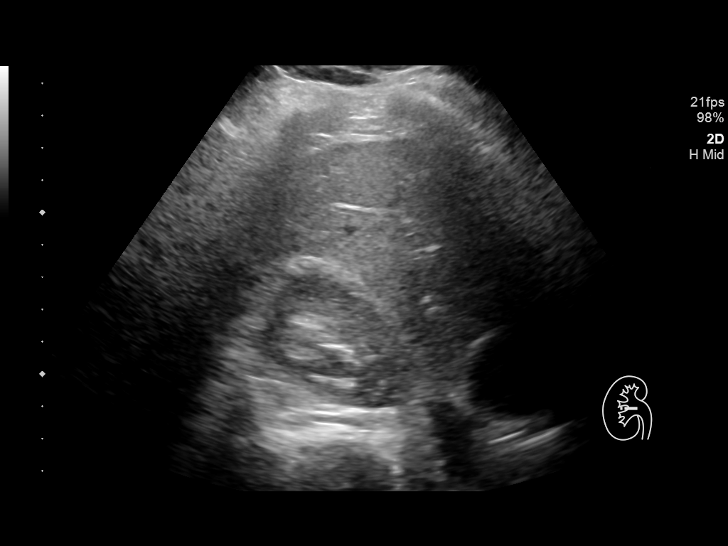
[im 88/106]
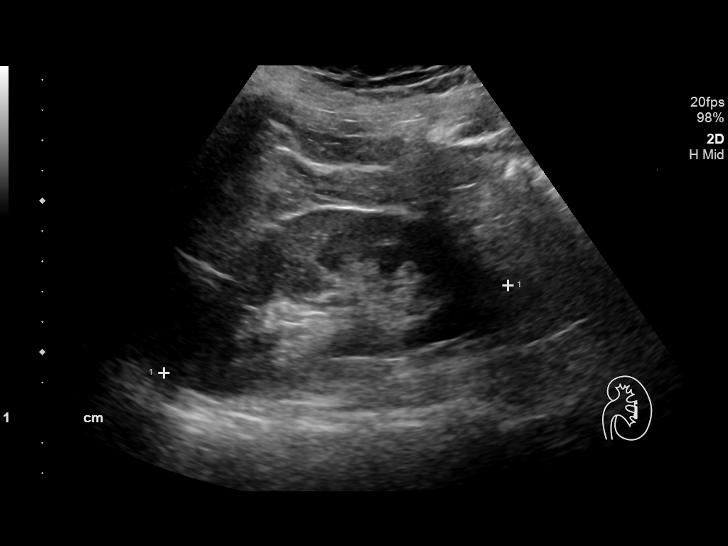
[im 97/106]
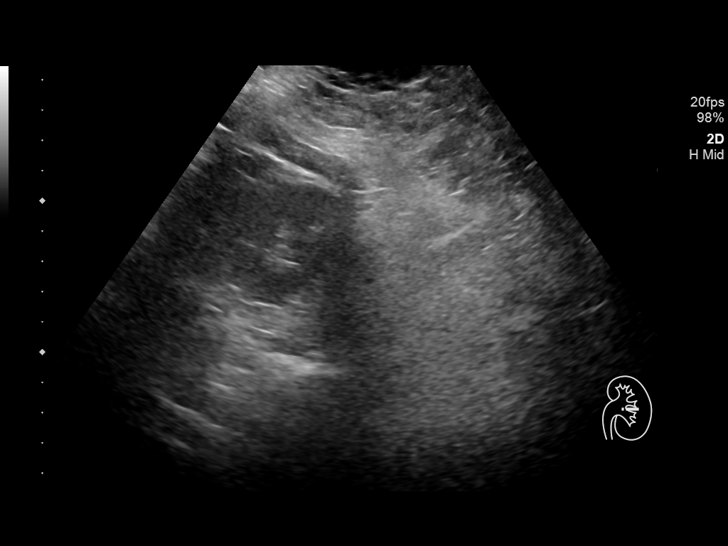
[im 106/106]
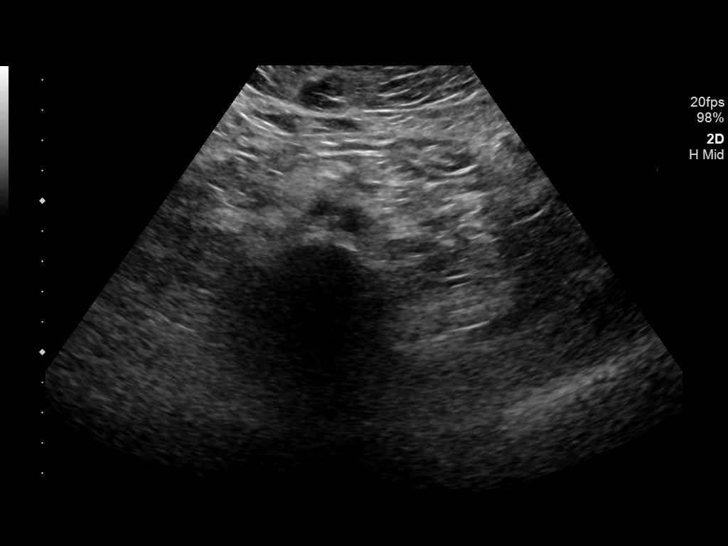

[14 of 25 positions shown; findings below may reference images not displayed]

FINDINGS: Gallbladder: 6 mm polyp. No stones. No wall thickening or
pericholecystic fluid.

Common bile duct: Diameter: 3 mm

Liver: No focal lesion identified. Within normal limits in
parenchymal echogenicity. Portal vein is patent on color Doppler
imaging with normal direction of blood flow towards the liver.

IVC: No abnormality visualized.

Pancreas: Visualized portion unremarkable.

Spleen: Size and appearance within normal limits.

Right Kidney: Length: 10.6 cm. Echogenicity within normal limits. No
mass or hydronephrosis visualized.

Left Kidney: Length: 11.7 cm. Echogenicity within normal limits. No
mass or hydronephrosis visualized.

Abdominal aorta: No aneurysm visualized.

Other findings: None.
IMPRESSION: 1. No acute findings.
2. 6 mm gallbladder polyp.  No other abnormalities.

## 2019-10-27 ENCOUNTER — Ambulatory Visit: Payer: 59 | Attending: Internal Medicine

## 2019-10-27 DIAGNOSIS — Z23 Encounter for immunization: Secondary | ICD-10-CM

## 2019-10-27 NOTE — Progress Notes (Signed)
   Covid-19 Vaccination Clinic  Name:  Catherine Downs    MRN: [id number] DOB: 1986/05/20  10/27/2019  Catherine Downs was observed post Covid-19 immunization for 15 minutes without incident. She was provided with Vaccine Information Sheet and instruction to access the V-Safe system.   Catherine Downs was instructed to call 911 with any severe reactions post vaccine: Marland Kitchen Difficulty breathing  . Swelling of face and throat  . A fast heartbeat  . A bad rash all over body  . Dizziness and weakness   Immunizations Administered    Name Date Dose VIS Date Route   Pfizer COVID-19 Vaccine 10/27/2019  8:57 AM 0.3 mL 07/02/2019 Intramuscular   Manufacturer: [employer], [employer]   Lot: [id number]   NDC: [address]-9

## 2020-03-23 ENCOUNTER — Other Ambulatory Visit: Payer: Self-pay | Admitting: Family Medicine

## 2020-03-23 DIAGNOSIS — E049 Nontoxic goiter, unspecified: Secondary | ICD-10-CM

## 2020-04-13 ENCOUNTER — Ambulatory Visit
Admission: RE | Admit: 2020-04-13 | Discharge: 2020-04-13 | Disposition: A | Discharge status: Home / Self Care | Payer: 59 | Source: Ambulatory Visit | Attending: Family Medicine | Admitting: Family Medicine

## 2020-04-13 DIAGNOSIS — E049 Nontoxic goiter, unspecified: Secondary | ICD-10-CM

## 2020-06-29 ENCOUNTER — Ambulatory Visit: Payer: 59 | Attending: Internal Medicine

## 2020-06-29 DIAGNOSIS — Z23 Encounter for immunization: Secondary | ICD-10-CM

## 2020-06-29 NOTE — Progress Notes (Signed)
   Covid-19 Vaccination Clinic  Name:  Catherine Downs    MRN: [id number] DOB: March 12, 1986  06/29/2020  Catherine Downs was observed post Covid-19 immunization for 15 minutes without incident. She was provided with Vaccine Information Sheet and instruction to access the V-Safe system.   Catherine Downs was instructed to call 911 with any severe reactions post vaccine: Marland Kitchen Difficulty breathing  . Swelling of face and throat  . A fast heartbeat  . A bad rash all over body  . Dizziness and weakness   Immunizations Administered    Name Date Dose VIS Date Route   Pfizer COVID-19 Vaccine 06/29/2020  1:46 PM 0.3 mL 05/10/2020 Intramuscular   Manufacturer: [employer], [employer]   Lot: [id number]   NDC: [address]-5

## 2021-08-16 ENCOUNTER — Encounter: Payer: Self-pay | Admitting: Family Medicine

## 2022-07-04 DIAGNOSIS — G5601 Carpal tunnel syndrome, right upper limb: Secondary | ICD-10-CM | POA: Insufficient documentation

## 2022-09-08 ENCOUNTER — Ambulatory Visit
Admission: RE | Admit: 2022-09-08 | Discharge: 2022-09-08 | Disposition: A | Discharge status: Home / Self Care | Payer: 59 | Source: Ambulatory Visit | Attending: Urgent Care | Admitting: Urgent Care

## 2022-09-08 VITALS — BP 129/92 | HR 85 | Temp 99.0°F | Resp 18 | Ht 64.0 in | Wt 170.0 lb

## 2022-09-08 DIAGNOSIS — J018 Other acute sinusitis: Secondary | ICD-10-CM

## 2022-09-08 HISTORY — DX: Carpal tunnel syndrome, unspecified upper limb: G56.00

## 2022-09-08 MED ORDER — PSEUDOEPHEDRINE HCL 60 MG PO TABS: 60.0000 mg | ORAL_TABLET | Freq: Three times a day (TID) | ORAL | 0 refills | Status: AC | PRN

## 2022-09-08 MED ORDER — DOXYCYCLINE HYCLATE 100 MG PO CAPS: 100.0000 mg | ORAL_CAPSULE | Freq: Two times a day (BID) | ORAL | 0 refills | Status: AC

## 2022-09-08 MED ORDER — CETIRIZINE HCL 10 MG PO TABS: 10.0000 mg | ORAL_TABLET | Freq: Every day | ORAL | 0 refills | Status: AC

## 2022-09-08 NOTE — ED Triage Notes (Signed)
X2 days Pt states that she has body aches, chills, facial pain and ear pain.

## 2022-09-08 NOTE — ED Provider Notes (Signed)
[facility]  Note:  This document was prepared using Systems analyst and may include unintentional dictation errors.  MRN: [id number] DOB: April 20, 1986  Subjective:   Catherine Downs is a 37 y.o. female presenting for 2 week history of throat congestion, drainage, sinus congestion, hoarseness, then started getting body aches and chills.  She did achieve symptom relief for 2 days in between and then in the past 3 days became very sick again worse than she was initially.  No fever, chest pain, shob, wheezing. No smoking.  No history of bad allergies or asthma.   Current Facility-Administered Medications:    0.9 %  sodium chloride infusion, 500 mL, Intravenous, Once, Nandigam, Kavitha V, MD   ondansetron (ZOFRAN-ODT) disintegrating tablet 8 mg, 8 mg, Oral, Once, Bailey Mech E, PA-C  Current Outpatient Medications:    pantoprazole (PROTONIX) 40 MG tablet, Take 1 tablet (40 mg total) by mouth daily., Disp: 30 tablet, Rfl: 3   Allergies  Allergen Reactions   Penicillins Swelling    Past Medical History:  Diagnosis Date   Anxiety    Carpal tunnel syndrome    Chronic headaches    Depression    Ulcer jejunum      Past Surgical History:  Procedure Laterality Date   NO PAST SURGERIES      Family History  Problem Relation Age of Onset   Diabetes Mother    Hyperlipidemia Mother    Hypertension Mother    Diabetes Father    Hyperlipidemia Father    Hypertension Father    Leukemia Maternal Grandfather    Parkinson's disease Paternal Grandfather    Stroke Paternal Grandfather    Diabetes Paternal Grandfather    Heart disease Paternal Grandfather    Hyperlipidemia Paternal Grandfather    Colon polyps Paternal Grandfather    Diabetes Sister    Hypertension Sister    Diabetes Maternal Grandmother    Hyperlipidemia Maternal Grandmother    Hypertension Maternal Grandmother    Diabetes Paternal Grandmother    Hyperlipidemia Paternal  Grandmother    Hypertension Paternal Grandmother    Colon polyps Paternal Grandmother    Ulcerative colitis Paternal Grandmother    Hypertension Sister    Migraines Neg Hx     Social History   Tobacco Use   Smoking status: Never   Smokeless tobacco: Never  Substance Use Topics   Alcohol use: Yes    Alcohol/week: 0.0 standard drinks of alcohol    Comment: 1 per day   Drug use: No    ROS   Objective:   Vitals: BP (!) 129/92 (BP Location: Left Arm)   Pulse 85   Temp 99 F (37.2 C) (Oral)   Resp 18   Ht 5' 4"$  (1.626 m)   Wt 170 lb (77.1 kg)   LMP 08/24/2022   SpO2 96%   BMI 29.18 kg/m   Physical Exam Constitutional:      General: She is not in acute distress.    Appearance: Normal appearance. She is well-developed and normal weight. She is not ill-appearing, toxic-appearing or diaphoretic.  HENT:     Head: Normocephalic and atraumatic.     Right Ear: Tympanic membrane, ear canal and external ear normal. No drainage or tenderness. No middle ear effusion. There is no impacted cerumen. Tympanic membrane is not erythematous or bulging.     Left Ear: Tympanic membrane, ear canal and external ear normal. No drainage or tenderness.  No middle ear effusion. There is  no impacted cerumen. Tympanic membrane is not erythematous or bulging.     Nose: Congestion and rhinorrhea present.     Mouth/Throat:     Mouth: Mucous membranes are moist. No oral lesions.     Pharynx: No pharyngeal swelling, oropharyngeal exudate, posterior oropharyngeal erythema or uvula swelling.     Tonsils: No tonsillar exudate or tonsillar abscesses.     Comments: Thick streaks of postnasal drainage overlying pharynx. Eyes:     General: No scleral icterus.       Right eye: No discharge.        Left eye: No discharge.     Extraocular Movements: Extraocular movements intact.     Right eye: Normal extraocular motion.     Left eye: Normal extraocular motion.     Conjunctiva/sclera: Conjunctivae normal.   Cardiovascular:     Rate and Rhythm: Normal rate and regular rhythm.     Heart sounds: Normal heart sounds. No murmur heard.    No friction rub. No gallop.  Pulmonary:     Effort: Pulmonary effort is normal. No respiratory distress.     Breath sounds: No stridor. No wheezing, rhonchi or rales.  Chest:     Chest wall: No tenderness.  Musculoskeletal:     Cervical back: Normal range of motion and neck supple.  Lymphadenopathy:     Cervical: No cervical adenopathy.  Skin:    General: Skin is warm and dry.  Neurological:     General: No focal deficit present.     Mental Status: She is alert and oriented to person, place, and time.  Psychiatric:        Mood and Affect: Mood normal.        Behavior: Behavior normal.     Assessment and Plan :   PDMP not reviewed this encounter.  1. Acute non-recurrent sinusitis of other sinus     Patient has a significant allergy to penicillin with swelling and therefore we will hold off on using a cephalosporin. Will start empiric treatment for sinusitis with doxycycline.  Recommended supportive care otherwise. Deferred imaging given clear cardiopulmonary exam, hemodynamically stable vital signs. Counseled patient on potential for adverse effects with medications prescribed/recommended today, ER and return-to-clinic precautions discussed, patient verbalized understanding.    Jaynee Eagles, PA-C 09/08/22 1058

## 2023-12-10 ENCOUNTER — Ambulatory Visit (INDEPENDENT_AMBULATORY_CARE_PROVIDER_SITE_OTHER)

## 2023-12-10 ENCOUNTER — Ambulatory Visit
Admission: RE | Admit: 2023-12-10 | Discharge: 2023-12-10 | Disposition: A | Discharge status: Home / Self Care | Source: Ambulatory Visit

## 2023-12-10 ENCOUNTER — Ambulatory Visit: Payer: Self-pay | Admitting: Nurse Practitioner

## 2023-12-10 VITALS — BP 130/88 | HR 78 | Temp 97.7°F | Resp 16

## 2023-12-10 DIAGNOSIS — M79641 Pain in right hand: Secondary | ICD-10-CM

## 2023-12-10 MED ORDER — PREDNISONE 10 MG PO TABS: ORAL_TABLET | ORAL | 0 refills | Status: AC

## 2023-12-10 NOTE — Discharge Instructions (Addendum)
 We will let you know what your x-ray results are once the radiologist has looked at the film. Ibuprofen/Tylenol with breakfast, lunch, and dinner for 3 days.  Read the RICE precautions I've attached to your discharge. Follow back up with [facility] for re-evaluation of your hand. Apply cool compresses frequently. Wear the wrist/hand brace for comfort.

## 2023-12-10 NOTE — ED Triage Notes (Signed)
 Pt reports shooting pain that starts in knuckles above R pinky and ring fingers x2 days. Pain radiates up to elbow. No trauma or injury to area. 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 No release surgery but did steroid injections to the area years ago. No relief with advil.

## 2023-12-10 NOTE — ED Provider Notes (Signed)
 [name]    CSN: [id number] Arrival date & time: 12/10/23  1032      History   Chief Complaint Chief Complaint  Patient presents with   Hand Problem    Starting 2 days ago, experiencing increased pain in my right hand, at base of last two fingers. Stiff and radiating pain in hand and up arm now. Painful to touch - Entered by patient    HPI Catherine Downs is a 38 y.o. female.   Patient presents requesting evaluation for the new onset of right 4th and 5th finger swelling and discomfort.  Symptom onset was 2 days ago.  No reported trauma.  She does work at [employer] a lot.  This has occurred before and she has received injections in her hand at [facility].  She does endorse some mild tingling to the affected fingers.  Patient has taken Tylenol, ibuprofen, and utilized a wrist brace without any improvement in the symptoms.  No reported erythema, warmth, or skin changes.  The remainder of her right upper extremity is unremarkable.  She is right-hand dominant.  The history is provided by the patient.    Past Medical History:  Diagnosis Date   Anxiety    Carpal tunnel syndrome    Chronic headaches    Depression    Ulcer jejunum     Patient Active Problem List   Diagnosis Date Noted   Carpal tunnel syndrome of right wrist 07/04/2022   Post concussion syndrome 08/03/2015    Past Surgical History:  Procedure Laterality Date   NO PAST SURGERIES      OB History   No obstetric history on file.      Home Medications    Prior to Admission medications   Medication Sig Start Date End Date Taking? Authorizing Provider  cetirizine  (ZYRTEC  ALLERGY) 10 MG tablet Take 1 tablet (10 mg total) by mouth daily. 09/08/22  Yes Adolph Hoop, PA-C  pantoprazole  (PROTONIX ) 40 MG tablet Take 1 tablet (40 mg total) by mouth daily. 09/01/17  Yes Stallings, Zoe A, MD  predniSONE (DELTASONE) 10 MG tablet Take 6 tablets (60 mg total) by mouth daily with breakfast for 1 day, THEN 5  tablets (50 mg total) daily with breakfast for 1 day, THEN 4 tablets (40 mg total) daily with breakfast for 1 day, THEN 3 tablets (30 mg total) daily with breakfast for 1 day, THEN 2 tablets (20 mg total) daily with breakfast for 1 day, THEN 1 tablet (10 mg total) daily with breakfast for 1 day. 12/10/23 12/16/23 Yes Genene Kennel, FNP  Butalbital-Acetaminophen (BUPAP) 50-300 MG TABS Take 1-2tablet(s) EVERY 4 HOURS by oral route prn muscle tension HA Patient not taking: Reported on 12/10/2023 07/18/15   [provider]  doxycycline  (VIBRAMYCIN ) 100 MG capsule Take 1 capsule (100 mg total) by mouth 2 (two) times daily. Patient not taking: Reported on 12/10/2023 09/08/22   Adolph Hoop, PA-C  pseudoephedrine  (SUDAFED) 60 MG tablet Take 1 tablet (60 mg total) by mouth every 8 (eight) hours as needed for congestion. Patient not taking: Reported on 12/10/2023 09/08/22   Adolph Hoop, PA-C    Family History Family History  Problem Relation Age of Onset   Diabetes Mother    Hyperlipidemia Mother    Hypertension Mother    Diabetes Father    Hyperlipidemia Father    Hypertension Father    Leukemia Maternal Grandfather    Parkinson's disease Paternal Grandfather    Stroke Paternal Grandfather  Diabetes Paternal Grandfather    Heart disease Paternal Grandfather    Hyperlipidemia Paternal Grandfather    Colon polyps Paternal Grandfather    Diabetes Sister    Hypertension Sister    Diabetes Maternal Grandmother    Hyperlipidemia Maternal Grandmother    Hypertension Maternal Grandmother    Diabetes Paternal Grandmother    Hyperlipidemia Paternal Grandmother    Hypertension Paternal Grandmother    Colon polyps Paternal Grandmother    Ulcerative colitis Paternal Grandmother    Hypertension Sister    Migraines Neg Hx     Social History Social History   Tobacco Use   Smoking status: Never   Smokeless tobacco: Never  Vaping Use   Vaping status: Never Used  Substance Use Topics    Alcohol use: Yes    Alcohol/week: 0.0 standard drinks of alcohol    Comment: 1 per day   Drug use: No     Allergies   Penicillins   Review of Systems Review of Systems  Constitutional:  Negative for chills and fever.  HENT:  Negative for congestion, rhinorrhea and sore throat.   Eyes:  Negative for pain and redness.  Respiratory:  Negative for cough and shortness of breath.   Cardiovascular:  Negative for chest pain and palpitations.  Gastrointestinal:  Negative for abdominal pain, diarrhea and vomiting.  Genitourinary:  Negative for dysuria.  Musculoskeletal:  Positive for arthralgias and joint swelling.  Skin:  Negative for rash.  Neurological:  Negative for dizziness and headaches.     Physical Exam Triage Vital Signs ED Triage Vitals [12/10/23 1131]  Encounter Vitals Group     BP 130/88     Systolic BP Percentile      Diastolic BP Percentile      Pulse Rate 78     Resp 16     Temp 97.7 F (36.5 C)     Temp Source Oral     SpO2 98 %     Weight      Height      Head Circumference      Peak Flow      Pain Score 4     Pain Loc      Pain Education      Exclude from Growth Chart    No data found.  Updated Vital Signs BP 130/88 (BP Location: Right Arm)   Pulse 78   Temp 97.7 F (36.5 C) (Oral)   Resp 16   LMP 11/17/2023 (Exact Date)   SpO2 98%   Visual Acuity Right Eye Distance:   Left Eye Distance:   Bilateral Distance:    Right Eye Near:   Left Eye Near:    Bilateral Near:     Physical Exam Vitals and nursing note reviewed.  Constitutional:      Appearance: Normal appearance.  Cardiovascular:     Rate and Rhythm: Normal rate and regular rhythm.     Heart sounds: Normal heart sounds.  Pulmonary:     Effort: Pulmonary effort is normal.     Breath sounds: Normal breath sounds.  Abdominal:     General: Bowel sounds are normal.  Musculoskeletal:     Right wrist: Bony tenderness present. Normal range of motion.     Right hand: Swelling,  tenderness and bony tenderness present. Decreased range of motion. Normal pulse.     Comments: Patient demonstrates pain, swelling, and decreased range of motion to the fourth and fifth MCP joint.  Decreased ability to flex the fourth  and fifth finger due to discomfort.  Limited ability to hand grip as well.  Is able to flex/extend the wrist.  Neurovascular status is intact distally.  Forearm, elbow, humerus, and shoulder are unremarkable.  Skin:    General: Skin is warm and dry.  Neurological:     General: No focal deficit present.     Mental Status: She is alert and oriented to person, place, and time.  Psychiatric:        Mood and Affect: Mood normal.        Behavior: Behavior normal.        Thought Content: Thought content normal.        Judgment: Judgment normal.    UC Treatments / Results  Labs (all labs ordered are listed, but only abnormal results are displayed) Labs Reviewed - No data to display  EKG   Radiology No results found.  Procedures Procedures (including critical care time)  Medications Ordered in UC Medications - No data to display  Initial Impression / Assessment and Plan / UC Course  I have reviewed the triage vital signs and the nursing notes.  Pertinent labs & imaging results that were available during my care of the patient were reviewed by me and considered in my medical decision making (see chart for details).    Patient presents requesting evaluation for pain, swelling, and decreased range of motion to the right fourth/fifth finger.  Will plan for a short steroid taper, can continue Tylenol/Motrin in scheduled interval dosing along with a wrist brace/ulnar gutter combo.  She will follow back up with [facility] for ongoing care and management.  Pending x-rays of the right hand as she does not have any current imaging.  RICE precautions.  Final Clinical Impressions(s) / UC Diagnoses   Final diagnoses:  Pain of right hand     Discharge  Instructions      We will let you know what your x-ray results are once the radiologist has looked at the film. Ibuprofen/Tylenol with breakfast, lunch, and dinner for 3 days.  Read the RICE precautions I've attached to your discharge. Follow back up with [facility] for re-evaluation of your hand. Apply cool compresses frequently. Wear the wrist/hand brace for comfort.   ED Prescriptions     Medication Sig Dispense Auth. Provider   predniSONE (DELTASONE) 10 MG tablet Take 6 tablets (60 mg total) by mouth daily with breakfast for 1 day, THEN 5 tablets (50 mg total) daily with breakfast for 1 day, THEN 4 tablets (40 mg total) daily with breakfast for 1 day, THEN 3 tablets (30 mg total) daily with breakfast for 1 day, THEN 2 tablets (20 mg total) daily with breakfast for 1 day, THEN 1 tablet (10 mg total) daily with breakfast for 1 day. 21 tablet Genene Kennel, FNP      PDMP not reviewed this encounter.   Genene Kennel, FNP 12/10/23 [id number]

## 2024-03-19 ENCOUNTER — Other Ambulatory Visit: Payer: Self-pay | Admitting: Internal Medicine

## 2024-03-19 ENCOUNTER — Ambulatory Visit
Admission: RE | Admit: 2024-03-19 | Discharge: 2024-03-19 | Disposition: A | Discharge status: Home / Self Care | Source: Ambulatory Visit | Attending: Internal Medicine | Admitting: Internal Medicine

## 2024-03-19 DIAGNOSIS — M5126 Other intervertebral disc displacement, lumbar region: Secondary | ICD-10-CM
